# Patient Record
Sex: Female | Born: 1960 | Race: Black or African American | Hispanic: No | Smoking: Never smoker
Health system: Southern US, Community
[De-identification: ages and names within clinical notes are randomized; demographics above are authoritative.]

## PROBLEM LIST (undated history)

## (undated) DIAGNOSIS — K219 Gastro-esophageal reflux disease without esophagitis: Secondary | ICD-10-CM

## (undated) DIAGNOSIS — I2510 Atherosclerotic heart disease of native coronary artery without angina pectoris: Secondary | ICD-10-CM

## (undated) DIAGNOSIS — I639 Cerebral infarction, unspecified: Secondary | ICD-10-CM

## (undated) DIAGNOSIS — M1990 Unspecified osteoarthritis, unspecified site: Secondary | ICD-10-CM

## (undated) DIAGNOSIS — E7800 Pure hypercholesterolemia, unspecified: Secondary | ICD-10-CM

## (undated) DIAGNOSIS — G43909 Migraine, unspecified, not intractable, without status migrainosus: Secondary | ICD-10-CM

## (undated) DIAGNOSIS — R42 Dizziness and giddiness: Secondary | ICD-10-CM

## (undated) HISTORY — DX: Atherosclerotic heart disease of native coronary artery without angina pectoris: I25.10

## (undated) HISTORY — PX: CARDIAC CATHETERIZATION: SHX172

---
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
## 1998-12-11 ENCOUNTER — Emergency Department (HOSPITAL_COMMUNITY): Admission: EM | Admit: 1998-12-11 | Discharge: 1998-12-11 | Payer: Self-pay

## 2001-01-14 ENCOUNTER — Emergency Department (HOSPITAL_COMMUNITY): Admission: EM | Admit: 2001-01-14 | Discharge: 2001-01-14 | Payer: Self-pay | Admitting: Emergency Medicine

## 2002-04-06 ENCOUNTER — Emergency Department (HOSPITAL_COMMUNITY): Admission: EM | Admit: 2002-04-06 | Discharge: 2002-04-06 | Payer: Self-pay | Admitting: *Deleted

## 2003-05-17 ENCOUNTER — Emergency Department (HOSPITAL_COMMUNITY): Admission: EM | Admit: 2003-05-17 | Discharge: 2003-05-17 | Payer: Self-pay | Admitting: Emergency Medicine

## 2003-09-09 ENCOUNTER — Emergency Department (HOSPITAL_COMMUNITY): Admission: AD | Admit: 2003-09-09 | Discharge: 2003-09-09 | Payer: Self-pay | Admitting: Family Medicine

## 2003-11-05 ENCOUNTER — Inpatient Hospital Stay (HOSPITAL_COMMUNITY): Admission: EM | Admit: 2003-11-05 | Discharge: 2003-11-07 | Payer: Self-pay | Admitting: Emergency Medicine

## 2003-11-06 ENCOUNTER — Encounter (INDEPENDENT_AMBULATORY_CARE_PROVIDER_SITE_OTHER): Payer: Self-pay | Admitting: Cardiology

## 2003-11-29 ENCOUNTER — Emergency Department (HOSPITAL_COMMUNITY): Admission: EM | Admit: 2003-11-29 | Discharge: 2003-11-29 | Payer: Self-pay

## 2004-01-10 ENCOUNTER — Encounter: Admission: RE | Admit: 2004-01-10 | Discharge: 2004-01-10 | Payer: Self-pay | Admitting: Internal Medicine

## 2004-01-31 ENCOUNTER — Emergency Department (HOSPITAL_COMMUNITY): Admission: EM | Admit: 2004-01-31 | Discharge: 2004-01-31 | Payer: Self-pay | Admitting: Emergency Medicine

## 2004-05-21 ENCOUNTER — Emergency Department (HOSPITAL_COMMUNITY): Admission: EM | Admit: 2004-05-21 | Discharge: 2004-05-21 | Payer: Self-pay | Admitting: Emergency Medicine

## 2004-08-20 ENCOUNTER — Emergency Department (HOSPITAL_COMMUNITY): Admission: EM | Admit: 2004-08-20 | Discharge: 2004-08-20 | Payer: Self-pay | Admitting: Emergency Medicine

## 2004-08-23 ENCOUNTER — Emergency Department (HOSPITAL_COMMUNITY): Admission: EM | Admit: 2004-08-23 | Discharge: 2004-08-23 | Payer: Self-pay | Admitting: Emergency Medicine

## 2004-08-30 ENCOUNTER — Ambulatory Visit: Payer: Self-pay | Admitting: Family Medicine

## 2004-10-22 ENCOUNTER — Ambulatory Visit: Payer: Self-pay | Admitting: Family Medicine

## 2005-01-31 ENCOUNTER — Emergency Department (HOSPITAL_COMMUNITY): Admission: EM | Admit: 2005-01-31 | Discharge: 2005-01-31 | Payer: Self-pay | Admitting: Emergency Medicine

## 2006-01-20 ENCOUNTER — Ambulatory Visit: Payer: Self-pay | Admitting: Sports Medicine

## 2006-02-07 ENCOUNTER — Encounter (INDEPENDENT_AMBULATORY_CARE_PROVIDER_SITE_OTHER): Payer: Self-pay | Admitting: *Deleted

## 2006-02-07 LAB — CONVERTED CEMR LAB

## 2006-02-10 ENCOUNTER — Ambulatory Visit: Payer: Self-pay | Admitting: Family Medicine

## 2006-03-03 ENCOUNTER — Ambulatory Visit: Payer: Self-pay | Admitting: Family Medicine

## 2006-06-25 ENCOUNTER — Emergency Department (HOSPITAL_COMMUNITY): Admission: EM | Admit: 2006-06-25 | Discharge: 2006-06-25 | Payer: Self-pay | Admitting: Emergency Medicine

## 2006-08-31 ENCOUNTER — Ambulatory Visit: Payer: Self-pay | Admitting: Family Medicine

## 2006-12-11 ENCOUNTER — Emergency Department (HOSPITAL_COMMUNITY): Admission: EM | Admit: 2006-12-11 | Discharge: 2006-12-11 | Payer: Self-pay | Admitting: Emergency Medicine

## 2006-12-31 DIAGNOSIS — G43909 Migraine, unspecified, not intractable, without status migrainosus: Secondary | ICD-10-CM | POA: Insufficient documentation

## 2006-12-31 DIAGNOSIS — N951 Menopausal and female climacteric states: Secondary | ICD-10-CM | POA: Insufficient documentation

## 2007-01-01 ENCOUNTER — Encounter (INDEPENDENT_AMBULATORY_CARE_PROVIDER_SITE_OTHER): Payer: Self-pay | Admitting: *Deleted

## 2007-03-05 ENCOUNTER — Encounter: Payer: Self-pay | Admitting: Family Medicine

## 2007-03-05 ENCOUNTER — Encounter: Payer: Self-pay | Admitting: *Deleted

## 2007-03-05 ENCOUNTER — Ambulatory Visit: Payer: Self-pay | Admitting: Family Medicine

## 2007-03-05 LAB — CONVERTED CEMR LAB
Bilirubin Urine: NEGATIVE
Chlamydia, DNA Probe: NEGATIVE
GC Probe Amp, Genital: NEGATIVE
Glucose, Urine, Semiquant: NEGATIVE
Ketones, urine, test strip: NEGATIVE
Nitrite: NEGATIVE
Protein, U semiquant: 30
Specific Gravity, Urine: 1.025
Urobilinogen, UA: 0.2
pH: 6.5

## 2007-03-08 ENCOUNTER — Encounter: Payer: Self-pay | Admitting: Family Medicine

## 2007-03-08 ENCOUNTER — Telehealth: Payer: Self-pay | Admitting: *Deleted

## 2007-03-10 ENCOUNTER — Encounter: Payer: Self-pay | Admitting: Family Medicine

## 2007-03-23 ENCOUNTER — Encounter: Payer: Self-pay | Admitting: Family Medicine

## 2007-03-23 ENCOUNTER — Telehealth: Payer: Self-pay | Admitting: *Deleted

## 2007-03-23 ENCOUNTER — Ambulatory Visit: Payer: Self-pay

## 2007-03-23 LAB — CONVERTED CEMR LAB
BUN: 14 mg/dL (ref 6–23)
CO2: 24 meq/L (ref 19–32)
Calcium: 9.4 mg/dL (ref 8.4–10.5)
Chloride: 108 meq/L (ref 96–112)
Creatinine, Ser: 0.93 mg/dL (ref 0.40–1.20)
Glucose, Bld: 87 mg/dL (ref 70–99)
Potassium: 4 meq/L (ref 3.5–5.3)
Sodium: 142 meq/L (ref 135–145)

## 2007-03-26 ENCOUNTER — Encounter: Payer: Self-pay | Admitting: Family Medicine

## 2007-04-14 ENCOUNTER — Telehealth: Payer: Self-pay | Admitting: Family Medicine

## 2007-04-21 ENCOUNTER — Ambulatory Visit: Payer: Self-pay | Admitting: Family Medicine

## 2007-04-21 DIAGNOSIS — Z8639 Personal history of other endocrine, nutritional and metabolic disease: Secondary | ICD-10-CM

## 2007-04-21 DIAGNOSIS — Z862 Personal history of diseases of the blood and blood-forming organs and certain disorders involving the immune mechanism: Secondary | ICD-10-CM | POA: Insufficient documentation

## 2007-05-28 ENCOUNTER — Telehealth: Payer: Self-pay | Admitting: *Deleted

## 2008-03-07 ENCOUNTER — Ambulatory Visit: Payer: Self-pay | Admitting: Family Medicine

## 2008-03-09 ENCOUNTER — Ambulatory Visit: Payer: Self-pay | Admitting: Family Medicine

## 2008-04-06 ENCOUNTER — Ambulatory Visit: Payer: Self-pay | Admitting: Family Medicine

## 2008-04-06 DIAGNOSIS — E669 Obesity, unspecified: Secondary | ICD-10-CM | POA: Insufficient documentation

## 2008-04-06 LAB — CONVERTED CEMR LAB: Whiff Test: NEGATIVE

## 2008-08-11 ENCOUNTER — Encounter: Payer: Self-pay | Admitting: Family Medicine

## 2009-01-31 ENCOUNTER — Emergency Department (HOSPITAL_COMMUNITY): Admission: EM | Admit: 2009-01-31 | Discharge: 2009-01-31 | Payer: Self-pay | Admitting: Emergency Medicine

## 2010-03-17 ENCOUNTER — Emergency Department (HOSPITAL_COMMUNITY): Admission: EM | Admit: 2010-03-17 | Discharge: 2010-03-17 | Payer: Self-pay | Admitting: Emergency Medicine

## 2010-03-17 ENCOUNTER — Encounter: Payer: Self-pay | Admitting: Family Medicine

## 2010-03-18 ENCOUNTER — Encounter: Payer: Self-pay | Admitting: Family Medicine

## 2010-03-18 ENCOUNTER — Inpatient Hospital Stay (HOSPITAL_COMMUNITY): Admission: EM | Admit: 2010-03-18 | Discharge: 2010-03-19 | Payer: Self-pay | Admitting: Emergency Medicine

## 2010-03-18 ENCOUNTER — Ambulatory Visit: Payer: Self-pay | Admitting: Family Medicine

## 2010-03-19 ENCOUNTER — Encounter: Payer: Self-pay | Admitting: Family Medicine

## 2010-03-20 LAB — CONVERTED CEMR LAB
Cholesterol: 198 mg/dL
HDL: 49 mg/dL
LDL Cholesterol: 133 mg/dL
Triglycerides: 99 mg/dL

## 2010-03-21 ENCOUNTER — Ambulatory Visit: Payer: Self-pay | Admitting: Family Medicine

## 2010-03-21 ENCOUNTER — Encounter: Payer: Self-pay | Admitting: Family Medicine

## 2010-03-21 DIAGNOSIS — E785 Hyperlipidemia, unspecified: Secondary | ICD-10-CM | POA: Insufficient documentation

## 2010-03-21 DIAGNOSIS — I6350 Cerebral infarction due to unspecified occlusion or stenosis of unspecified cerebral artery: Secondary | ICD-10-CM | POA: Insufficient documentation

## 2010-03-27 ENCOUNTER — Telehealth: Payer: Self-pay | Admitting: Family Medicine

## 2010-03-28 ENCOUNTER — Encounter: Payer: Self-pay | Admitting: Family Medicine

## 2010-03-29 ENCOUNTER — Emergency Department (HOSPITAL_COMMUNITY): Admission: EM | Admit: 2010-03-29 | Discharge: 2010-03-29 | Payer: Self-pay | Admitting: Family Medicine

## 2010-03-29 ENCOUNTER — Telehealth: Payer: Self-pay | Admitting: Family Medicine

## 2010-06-12 ENCOUNTER — Encounter: Payer: Self-pay | Admitting: Family Medicine

## 2010-12-03 NOTE — Letter (Signed)
Summary: Out of Work  [facility]  [address]   [place], [place] [address]   Phone: [phone]  Fax: [phone]    Mar 21, 2010   Employee:  BETHEL SIROIS    To Whom It May Concern:   For Medical reasons, please excuse the above named employee from work for the following dates:  Start:   Mar 18, 2010  End:   Mar 22, 2010  It is OK for her to return to work after May 20th.  If you need additional information, please feel free to contact our office.         Sincerely,    Asher Muir MD

## 2010-12-03 NOTE — Letter (Signed)
Summary: Out of Work  [facility]  [address]   [place], [place] [address]   Phone: [phone]  Fax: [phone]    Mar 28, 2010   Employee:  SOLANA COGGIN    To Whom It May Concern:   For Medical reasons, please excuse the above named employee from work for the following dates:  Start:   03/26/2010  End:   03/26/2010  If you need additional information, please feel free to contact our office.         Sincerely,    Asher Muir MD

## 2010-12-03 NOTE — Progress Notes (Signed)
Summary: Note Needed   Phone Note Call from Patient Call back at [phone]   Caller: Patient Summary of Call: Pt was in hospital and went to work Monday, but did not go Tuesday due to feeling really bad.  Can she get a note for the rest of the week? Initial call taken by: Clydell Hakim,  Mar 27, 2010 1:49 PM  Follow-up for Phone Call        to PCP Follow-up by: Gladstone Pih,  Mar 27, 2010 2:49 PM  Additional Follow-up for Phone Call Additional follow up Details #1::        I wrote the letter, but please strongly advise her to come in for an appointment if she is feeling that badly.  thanks Additional Follow-up by: Asher Muir MD,  Mar 28, 2010 8:16 AM    called and left msg for pt to return call.Loralee Pacas CMA  Mar 28, 2010 12:27 PM

## 2010-12-03 NOTE — Consult Note (Signed)
Summary: [facility]: ECHO Report  [facility]   Imported By: Clydell Hakim 06/14/2010 12:14:24  _____________________________________________________________________  External Attachment:    Type:   Image     Comment:   External Document

## 2010-12-03 NOTE — Progress Notes (Signed)
Summary: possible re-CVA? sent to ED   Daughter Enrique Sack [phone]:  Yesterday afternoon eyes looked droopy on both sides. Mother not speaking well (running some words together). Sluggish all day today and slowed movements. Normally very energetic. No difficulty walking. No limp. Was diagnosed with stroke on the 15th. Told the patient to come be evalutated in the ED. She is taking her ASA and Simvastatin. Jamie Brookes MD  Mar 29, 2010 6:21 PM

## 2010-12-03 NOTE — Assessment & Plan Note (Signed)
Summary: h/fup,tcb   Vital Signs:  Patient profile:   50 year old female Height:      63 inches Weight:      206.1 pounds BMI:     36.64 Temp:     98.6 degrees F oral Pulse rate:   72 / minute BP sitting:   123 / 70  (left arm) Cuff size:   large  Vitals Entered By: Gladstone Pih (Mar 21, 2010 3:50 PM) CC: Hosp F/U for CVA Is Patient Diabetic? No Pain Assessment Patient in pain? no        Primary Care Provider:  Asher Muir MD  CC:  Hosp F/U for CVA.  History of Present Illness: Pt who has not been seen at [facility] since 2009 here for hospital f/u visit after a CVA.  1.  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  she was supposed to be on aspirin and zocor since that stroke, but was not taking them consistently.  had a normal work-up, including echo and carotid dopplers.  discharged on asa and zocor.  since d/c has had a headache on the left, but has had no confusion, facial droop, one-sided weakness or numbness, trouble speaking, vision changes, or other focal neuro deficits  2.  hyperlipidemia--is above goal at 133.  she was restarted on zocor  Habits & Providers  Alcohol-Tobacco-Diet     Tobacco Status: never  Current Medications (verified): 1)  Simvastatin 40 Mg Tabs (Simvastatin) .Marland Kitchen.. 1 Tab By Mouth At Bedtime For Cholesterol 2)  Aspir-Low 81 Mg Tbec (Aspirin) .Marland Kitchen.. 1 Tab By Mouth Daily  Allergies: 1)  ! * Codiene  Past History:  Past Medical History: CVA in [place] 10/2009  Rt frontal and Lt parietal infarct and small vessel ischemic disease CVA 5/11-- left anterior circulation infarct in left MCA territory. breast mass- benign- removed at age 65 NSVD X4 hospitalized for chest pain--found to be anxiety.  heart cath normal.  2005  Review of Systems General:  Denies fever. Neuro:  Complains of headaches; denies brief paralysis, difficulty with concentration, disturbances in coordination, inability to speak, numbness, poor balance, and visual  disturbances; she is having some residual memoy problems since the stroke, but these have improved since discharge.  Physical Exam  General:  Well-developed,well-nourished,in no acute distress; alert,appropriate and cooperative throughout examination Lungs:  Normal respiratory effort, chest expands symmetrically. Lungs are clear to auscultation, no crackles or wheezes. Heart:  Normal rate and regular rhythm. S1 and S2 normal without gallop, murmur, click, rub or other extra sounds. Neurologic:  alert & oriented X3 and major cranial nerves II-XII intact.  alert & oriented X3, cranial nerves II-XII intact, strength normal in all extremities, sensation intact to light touch, gait normal, and finger-to-nose normal.  normal rapid alternating movements Additional Exam:  vital signs reviewed    Impression & Recommendations:  Problem # 1:  Hx of CEREBROVASCULAR ACCIDENT (ICD-434.91) Assessment New  continue aspirin and simva.  gave red flags for seeking urgent medical care.  blood pressuer is well-controlled and she is not diabetic; so no further secondary prevention measures needed at this time Her updated medication list for this problem includes:    Aspir-low 81 Mg Tbec (Aspirin) .Marland Kitchen... 1 tab by mouth daily  Orders: [facility]- Est  Level 4 ([address])  Problem # 2:  DYSLIPIDEMIA (ICD-272.4) Assessment: New  goal is <100, preferably under 70.  cont simva.  recheck CMET in 12 weeks Her updated medication list for this problem includes:  Simvastatin 40 Mg Tabs (Simvastatin) .Marland Kitchen... 1 tab by mouth at bedtime for cholesterol  Orders: [facility]- Est  Level 4 ([address])  Problem # 3:  PREVENTIVE HEALTH CARE (ICD-V70.0) Assessment: Unchanged due for pap and mammo (past due)--advised to schedule also needs colonoscopy--will discuss at physical  Complete Medication List: 1)  Simvastatin 40 Mg Tabs (Simvastatin) .Marland Kitchen.. 1 tab by mouth at bedtime for cholesterol 2)  Aspir-low 81 Mg Tbec (Aspirin) .Marland Kitchen.. 1 tab by mouth  daily  Patient Instructions: 1)  It was nice to see you today. 2)  Make an appointment with Dr. Gerilyn Pilgrim for nutrtion counseling. 3)  I'm glad you've decided to take your medicines. 4)  Get your mammogram!!! 5)  Please schedule a physical and pap smear at your convenience. 6)  If you have worsening that does not get better with tylenol, face drooping, trouble speaking, memory problems, one-sided weakness or anything else that really worries you, call 911. 7)  Make a follow up appointment for your cholesterol around the middle of August.  Prescriptions: SIMVASTATIN 40 MG TABS (SIMVASTATIN) 1 tab by mouth at bedtime for cholesterol  #33 x 6   Entered and Authorized by:   Asher Muir MD   Signed by:   Asher Muir MD on 03/21/2010   Method used:   [facility]        [facility]  [address] [phone]* (retail)       [address]       [place], [place]  [address]       Ph: [phone] or [phone]       Fax: [phone]   RxID:   [id number]    Prevention & Chronic Care Immunizations   Influenza vaccine: Not documented    Tetanus booster: 02/07/2006: Done.    Pneumococcal vaccine: Not documented  Colorectal Screening   Hemoccult: Not documented    Colonoscopy: Not documented  Other Screening   Pap smear: Done.  (02/07/2006)    Mammogram: Not documented   Smoking status: never  (03/21/2010)  Lipids   Total Cholesterol: 198  (03/20/2010)   LDL: 133  (03/20/2010)   LDL Direct: Not documented   HDL: 49  (03/20/2010)   Triglycerides: 99  (03/20/2010)    SGOT (AST): Not documented   SGPT (ALT): Not documented   Alkaline phosphatase: Not documented   Total bilirubin: Not documented  Self-Management Support :    Lipid self-management support: Not documented

## 2010-12-18 ENCOUNTER — Encounter: Payer: Self-pay | Admitting: *Deleted

## 2011-01-15 ENCOUNTER — Telehealth: Payer: Self-pay | Admitting: Family Medicine

## 2011-01-15 MED ORDER — SIMVASTATIN 40 MG PO TABS: 40.0000 mg | ORAL_TABLET | Freq: Every day | ORAL | Status: DC

## 2011-01-15 NOTE — Telephone Encounter (Signed)
Spoke with daughter who called for patient because patient is at work and not able to call during our office hours. The symptoms she has been experiencing are chills Sunday, Monday, and Tuesday but she is feeling better today .  The meds in question are simvastatin and aspirin.   Daughter states she has been without meds all of 2012 because she has not been able to get to pharmacy to pick them up. advised that Rx for  aspirin is current, but she will need new Rx for Simvastatin. Will send message to MD to ask if she will refill.. [facility] [place].

## 2011-01-15 NOTE — Telephone Encounter (Signed)
Pt stopped taking both her meds & is having with drawels, wants to know if resuming the medication will be harmful?

## 2011-01-15 NOTE — Telephone Encounter (Signed)
Simvastatin and aspirin don't cause withdrawal.  It is safe to take.  I will refill simvastatin for one month, but patient needs to schedule office visit as I have never med her and is has been 10 months since her last appointment.

## 2011-01-15 NOTE — Telephone Encounter (Signed)
Patient notified

## 2011-01-18 ENCOUNTER — Emergency Department (HOSPITAL_COMMUNITY)
Admission: EM | Admit: 2011-01-18 | Discharge: 2011-01-18 | Disposition: A | Discharge status: Home / Self Care | Payer: Self-pay | Attending: Emergency Medicine | Admitting: Emergency Medicine

## 2011-01-18 DIAGNOSIS — W540XXA Bitten by dog, initial encounter: Secondary | ICD-10-CM | POA: Insufficient documentation

## 2011-01-18 DIAGNOSIS — Z79899 Other long term (current) drug therapy: Secondary | ICD-10-CM | POA: Insufficient documentation

## 2011-01-18 DIAGNOSIS — Z8673 Personal history of transient ischemic attack (TIA), and cerebral infarction without residual deficits: Secondary | ICD-10-CM | POA: Insufficient documentation

## 2011-01-18 DIAGNOSIS — Z7982 Long term (current) use of aspirin: Secondary | ICD-10-CM | POA: Insufficient documentation

## 2011-01-18 DIAGNOSIS — S01409A Unspecified open wound of unspecified cheek and temporomandibular area, initial encounter: Secondary | ICD-10-CM | POA: Insufficient documentation

## 2011-01-18 DIAGNOSIS — S01309A Unspecified open wound of unspecified ear, initial encounter: Secondary | ICD-10-CM | POA: Insufficient documentation

## 2011-01-20 LAB — PROTIME-INR
INR: 1.02 (ref 0.00–1.49)
Prothrombin Time: 13.3 seconds (ref 11.6–15.2)
Prothrombin Time: 13.4 seconds (ref 11.6–15.2)

## 2011-01-20 LAB — COMPREHENSIVE METABOLIC PANEL
ALT: 15 U/L (ref 0–35)
ALT: 16 U/L (ref 0–35)
AST: 27 U/L (ref 0–37)
Albumin: 3.4 g/dL — ABNORMAL LOW (ref 3.5–5.2)
Albumin: 4 g/dL (ref 3.5–5.2)
Alkaline Phosphatase: 105 U/L (ref 39–117)
Alkaline Phosphatase: 92 U/L (ref 39–117)
BUN: 10 mg/dL (ref 6–23)
CO2: 27 mEq/L (ref 19–32)
Calcium: 9.1 mg/dL (ref 8.4–10.5)
Chloride: 104 mEq/L (ref 96–112)
Creatinine, Ser: 0.94 mg/dL (ref 0.4–1.2)
GFR calc Af Amer: >60 mL/min (ref >60)
GFR calc Af Amer: >60 mL/min (ref >60)
GFR calc non Af Amer: >60 mL/min (ref >60)
Glucose, Bld: 98 mg/dL (ref 70–99)
Potassium: 3.5 mEq/L (ref 3.5–5.1)
Potassium: 3.9 mEq/L (ref 3.5–5.1)
Sodium: 137 mEq/L (ref 135–145)
Sodium: 139 mEq/L (ref 135–145)
Total Bilirubin: 0.6 mg/dL (ref 0.3–1.2)
Total Protein: 6.8 g/dL (ref 6.0–8.3)
Total Protein: 7.9 g/dL (ref 6.0–8.3)

## 2011-01-20 LAB — CBC
HCT: 40.2 % (ref 36.0–46.0)
Hemoglobin: 13.8 g/dL (ref 12.0–15.0)
MCHC: 34.2 g/dL (ref 30.0–36.0)
MCV: 90 fL (ref 78.0–100.0)
Platelets: 262 10*3/uL (ref 150–400)
Platelets: 288 10*3/uL (ref 150–400)
RBC: 4.46 MIL/uL (ref 3.87–5.11)
RDW: 14.1 % (ref 11.5–15.5)
RDW: 14.1 % (ref 11.5–15.5)
WBC: 8.2 10*3/uL (ref 4.0–10.5)

## 2011-01-20 LAB — DIFFERENTIAL
Basophils Absolute: 0.1 10*3/uL (ref 0.0–0.1)
Basophils Relative: 1 % (ref 0–1)
Basophils Relative: 3 % — ABNORMAL HIGH (ref 0–1)
Eosinophils Absolute: 0 10*3/uL (ref 0.0–0.7)
Eosinophils Absolute: 0.1 10*3/uL (ref 0.0–0.7)
Eosinophils Relative: 1 % (ref 0–5)
Lymphocytes Relative: 37 % (ref 12–46)
Lymphs Abs: 3 10*3/uL (ref 0.7–4.0)
Lymphs Abs: 3.4 10*3/uL (ref 0.7–4.0)
Monocytes Absolute: 0.2 10*3/uL (ref 0.1–1.0)
Monocytes Absolute: 0.3 10*3/uL (ref 0.1–1.0)
Monocytes Relative: 3 % (ref 3–12)
Monocytes Relative: 3 % (ref 3–12)
Neutro Abs: 4.8 10*3/uL (ref 1.7–7.7)
Neutrophils Relative %: 59 % (ref 43–77)

## 2011-01-20 LAB — GLUCOSE, CAPILLARY
Glucose-Capillary: 76 mg/dL (ref 70–99)
Glucose-Capillary: 80 mg/dL (ref 70–99)
Glucose-Capillary: 91 mg/dL (ref 70–99)
Glucose-Capillary: 93 mg/dL (ref 70–99)
Glucose-Capillary: 95 mg/dL (ref 70–99)
Glucose-Capillary: 95 mg/dL (ref 70–99)

## 2011-01-20 LAB — CK TOTAL AND CKMB (NOT AT ARMC)
CK, MB: 0.8 ng/mL (ref 0.3–4.0)
CK, MB: 1.2 ng/mL (ref 0.3–4.0)
Relative Index: 1 (ref 0.0–2.5)
Relative Index: INVALID (ref 0.0–2.5)
Total CK: 118 U/L (ref 7–177)
Total CK: 73 U/L (ref 7–177)

## 2011-01-20 LAB — CARDIAC PANEL(CRET KIN+CKTOT+MB+TROPI)
CK, MB: 0.8 ng/mL (ref 0.3–4.0)
Total CK: 78 U/L (ref 7–177)

## 2011-01-20 LAB — BRAIN NATRIURETIC PEPTIDE: Pro B Natriuretic peptide (BNP): <30 pg/mL (ref 0.0–100.0)

## 2011-01-20 LAB — LIPID PANEL
Cholesterol: 198 mg/dL (ref 0–200)
HDL: 49 mg/dL (ref >39)
LDL Cholesterol: 133 mg/dL — ABNORMAL HIGH (ref 0–99)
Total CHOL/HDL Ratio: 4 RATIO

## 2011-01-20 LAB — APTT
aPTT: 30 seconds (ref 24–37)
aPTT: 31 seconds (ref 24–37)

## 2011-01-20 LAB — TROPONIN I
Troponin I: 0.12 ng/mL — ABNORMAL HIGH (ref 0.00–0.06)
Troponin I: 0.13 ng/mL — ABNORMAL HIGH (ref 0.00–0.06)
Troponin I: 0.14 ng/mL — ABNORMAL HIGH (ref 0.00–0.06)

## 2011-01-22 NOTE — Consult Note (Signed)
NAME:  Sue Armstrong, Sue Armstrong                ACCOUNT NO.:  [id number]  MEDICAL RECORD NO.:  [id number]           PATIENT TYPE:  E  LOCATION:  [facility]                         FACILITY:  [facility]  PHYSICIAN:  Zola Button T. Lazarus Salines, M.D. DATE OF BIRTH:  1961-07-13  DATE OF CONSULTATION:  01/18/2011 DATE OF DISCHARGE:  01/18/2011                                CONSULTATION   CHIEF COMPLAINT:  Dog bite lacerations, right ear and neck.  HISTORY:  A 50 year old black female was playing with her father's dog several hours ago when it snapped at her right face and neck sustaining lacerations.  She is here for evaluation.  ENT was called in consultation for complex lacerations.  She did not lose consciousness. She did not lose an excessive amount of blood.  The dog has been previously healthy, familiar to the patient, and reportedly up-to-date on all his vaccinations.  She herself is probably not up-to-date on tetanus.  PHYSICAL EXAMINATION:  This is a pleasant slightly heavy-set middle-aged black female.  Mental status is intact.  She is mildly distressed.  She is not actively bleeding.  Facial nerve is intact, all branches including ramus, mandibularis on the right side.  She has an incomplete amputation of the auricular  lobule with complex stellate lacerations on the anterior and posterior surface of a full-thickness lobular laceration.  She has a 8-cm "wavy "laceration crossing the body of the mandible down into the anterior neck.  On exploration, this has deep extension approximately 6 cm.  There is a puncture wound just inferior, which was deepened to allow for a Penrose drain placement to communicate with the major cavity.  IMPRESSION: 1. Incomplete amputation/complex full-thickness laceration, right     auricular lobule. 2. 8-cm laceration, right upper neck with deep plain penetration. 3. Superficial punctures and excoriations.  PLAN:  With informed consent, I anesthetized all sites with 2%  Xylocaine with 1:200,000 epinephrine totally 25 mL by the end of the procedure. After achieving anesthesia, a sterile preparation with 50:50 mixture of Betadine solution and sterile saline was performed.  Hemostasis was still observed.  Beginning with the lobule, the pieces were manipulated to inappropriate fit.  The deep layers were closed with interrupted 4-0 Vicryl to reapproximate the basic structure.  After reapproximating the several small flaps into something the roughly the shape of the lobule, then the multiple lacerations were closed with interrupted 5-0 Ethilon suture. The tip of the lobule was very slightly dusky, but seem to have reasonably good circulation throughout.  Good configuration of the lobule was accomplished and hemostasis was observed.  Attention was turned to the neck.  The posterior aspect of the laceration was still somewhat sensitive and additional Xylocaine with epinephrine along the total above was infiltrated.  The wound was cleaned with Betadine and saline again.  There was no visible foreign material in the wound.  The puncture wound inferior to the main cavity was extended into the main cavity using the suture scissors.  A one- quarter inch Penrose drain was brought out of this wound and packed into the cavity.  The wound itself was closed in a  cosmetic fashion with subcutaneous 4-0 Vicryl sutures and a running simple 5-0 Ethilon on the skin surface.  The drain was secured with the same suture.  Hemostasis was observed.  Both wounds were cleaned.  Bacitracin ointment was applied.  A fluff, 4-inch Kerlix followed by 3-inch Ace wrap pressure dressing was placed on the neck. The patient was at this point completed.  The ER physicians will give her a prescription for antibiotics and analgesics.  I recommend ice and elevation.  I will see her back in my office in 2 days for dressing change and probable drain removal.     Zola Button T. Lazarus Salines,  M.D.     KTW/MEDQ  D:  01/18/2011  T:  01/19/2011  Job:  [id number]  Electronically Signed by Flo Shanks M.D. on 01/22/2011 07:19:46 PM

## 2011-03-21 NOTE — Cardiovascular Report (Signed)
NAME:  KENIDY, CROSSLAND                          ACCOUNT NO.:  [id number]   MEDICAL RECORD NO.:  [id number]                   PATIENT TYPE:  INP   LOCATION:  2033                                 FACILITY:  [facility]   PHYSICIAN:  Darden Palmer., M.D.         DATE OF BIRTH:  11-02-1961   DATE OF PROCEDURE:  11/06/2003  DATE OF DISCHARGE:                              CARDIAC CATHETERIZATION   PROCEDURE:  Cardiac catheterization.   CARDIOLOGIST:  Georga Hacking, M.D.   INDICATIONS:  [age]-year-old black female presented with prolonged  chest discomfort that was associated with mild elevations in troponin and CK-  MB.  This study is done to evaluate for coronary artery disease.   DESCRIPTION OF PROCEDURE:  The patient tolerated the procedure well without  complications.  She had good hemostasis at the peripheral pulses noted.  The  right coronary artery received intracoronary nitroglycerin with no change in  the caliber of the distal vessel at the end of the procedure. She tolerated  the procedure well.   HEMODYNAMIC DATA:  Aorta post contrast 102/64, LV post contrast 102/6-13.  6 French coronary catheters and a 7 French Swan-Ganz catheter.   ANGIOGRAPHIC DATA:  LEFT VENTRICULOGRAM:  Performed in the 30 degree RAO  projection.  The aortic valve was normal.  The mitral valve was normal.  The  left ventricle is normal in size.  There was mild hypokinesis possibly of  the mid inferior wall.  Estimated ejection fraction is 50-55%.   CORONARY ARTERIOGRAPHY:  Coronary arteries arise and distribute normally.  No calcification is noted.   The left main coronary artery is normal.   The left anterior descending is a large vessel that extends around the apex  and is normal.   Circumflex coronary artery:  Normal.   Right coronary artery:  The right coronary artery is normal proximally and  in the posterior descending.  A posterior lateral branch is very diminutive  in size off  the continuation branch and is thread-like.  This may represent  a congenital abnormality or could be small vessel disease.  It does not  reverse with nitroglycerin.   IMPRESSION:  1. No significant large vessel coronary artery disease identified.  The     coronaries appear normal.  2. Very diminutive posterior lateral branch that may be a normal variant.  3. Evidence of normal left ventricular function.                                               Darden Palmer., M.D.    WST/MEDQ  D:  11/06/2003  T:  11/06/2003  Job:  [id number]   cc:   Melissa L. Ladona Ridgel, MD

## 2011-03-21 NOTE — Consult Note (Signed)
NAME:  Sue Armstrong, Sue Armstrong                          ACCOUNT NO.:  [id number]   MEDICAL RECORD NO.:  [id number]                   PATIENT TYPE:  INP   LOCATION:  1824                                 FACILITY:  [facility]   PHYSICIAN:  W. Ashley Royalty., M.D.         DATE OF BIRTH:  1960/12/25   DATE OF CONSULTATION:  11/05/2003  DATE OF DISCHARGE:                                   CONSULTATION   HISTORY:  This is a 50 year old black female seen at the request of the  emergency room physician for evaluation of chest discomfort.  The patient  has previously been in good health and has felt well.  She is a nonsmoker  and has no prior history of significant medical problems except for a  possible history of ulcers previously.  She had the onset of anterior  discomfort around 10 a.m. this morning described as pressure and heaviness  that came on at rest.  She noted the pain was fairly severe but no  associated with diaphoresis.  She finally was brought to the emergency room  by her family around 6 p.m. but was not brought back to the examination room  until approximately 12 midnight or 1 a.m.  At that time an EKG was done that  showed mild diffuse ST elevation and a set of cardiac markers was drawn.  When the cardiac markers returned with some abnormality I was called by the  emergency room physician and was asked to see the patient.  The patient  continues to have some mild ongoing chest discomfort rated at no more than 4  out of 5.  It is non radiating but was associated with some mild tingling of  her lips and her fingers.  She denies any recent history of cocaine use or  alcohol use and normally does not use any medications.  She has not had any  recent infections.   PAST MEDICAL HISTORY:  Negative for hypertension or diabetes.  Her  cholesterol status is unknown.   PREVIOUS SURGERY:  Removal of breast cyst.   ALLERGIES:  None.   MEDICATIONS:  None.   FAMILY HISTORY:  Father is living.   Her mother died and was hemodialysis,  died of some sort of heart disease.  She has a sister that has a valve  problem.  There is not family history of premature cardiac disease however.   SOCIAL HISTORY:  She states that she recently married her husband who  appears older than her, about one month ago.  She has two children at home,  two that are no longer at home.  She is a nonsmoker and does not use alcohol  to excess.  She states that she used cocaine maybe 25 years ago but denies  any recent history of drug use or cocaine use.   REVIEW OF SYSTEMS:  She has not had a period since November.  She has no  eye, nose or throat problems.  Does relate a prior history of ulcers.  Denies significant paroxysmal nocturnal dyspnea, orthopnea or edema.  Denies  recent upper respiratory infections or infections.  Denies any genitourinary  problems.  No significant arthritis.   PHYSICAL EXAMINATION:  GENERAL:  She is a thin, black female who is awake  and alert and has no acute distress.  VITAL SIGNS:  Blood pressure is 110/70, pulse 70 and regular.  SKIN:  Warm and dry.  HEENT:  Pupils are equal, round and reactive to light and accommodation.  Extraocular movements are intact. Mouth clear. Pharynx negative.  NECK:  Supple without masses, JVD, thyromegaly or bruits.  LUNGS:  Clear to auscultation and percussion.  CARDIOVASCULAR:  Normal S1, S2, no S3, no rub noted.  ABDOMEN:  Soft and nontender.  Femoral, distal pulses are 2+.   EKG shows mild diffuse ST elevation that could be interpreted as early  repolarization or possible subtle pericarditis.  Chest x-ray was normal.  Her urine pregnancy test is normal.  Initial myoglobin is 81.  CPK is 12.6  and troponin is 5.2.   IMPRESSION:  Prolonged chest discomfort which has been present for over 14  hours with very minimal elevation of CPK and MB.  No acute EKG changes are  noted.  Differential possibility would include an acute coronary syndrome  or  an atypical presentation of myocarditis or pericarditis.   RECOMMENDATIONS:  I would admit her to the hospital, begin on IV heparin,  nitroglycerin, beta blockers, and check serial enzymes.  Because of abnormal  troponins and CPK, likely will require cardiac catheterization for  evaluation to exclude problems.  She may need to have a spiral CT of the  chest looking for pulmonary emboli.  Should have a urine pregnancy test  also.   Thank you for asking me to see her with you.                                               Darden Palmer., M.D.    WST/MEDQ  D:  11/05/2003  T:  11/05/2003  Job:  [id number]

## 2011-03-21 NOTE — Discharge Summary (Signed)
NAME:  Sue Armstrong, Sue Armstrong                          ACCOUNT NO.:  [id number]   MEDICAL RECORD NO.:  [id number]                   PATIENT TYPE:  INP   LOCATION:  2033                                 FACILITY:  [facility]   PHYSICIAN:  Sherin Quarry, MD                   DATE OF BIRTH:  07-15-1961   DATE OF ADMISSION:  11/04/2003  DATE OF DISCHARGE:  11/07/2003                                 DISCHARGE SUMMARY   HISTORY OF PRESENT ILLNESS:  The patient is a 50 year old lady who presented  on January 2, with severe chest pain associated with diaphoresis and  dyspnea.  There was a family history of coronary artery disease.  Physical  examination at the time of admission is described by Hortencia Pilar, M.D.  Breech presentation is 108/66, pulse is 74, respirations 20, and O2  saturation was 100%.  HEENT examination was within normal limits, the chest  was clear.  Cardiovascular examination revealed normal S1 and S2 without  murmurs, rubs, or gallops.  Examination of the extremities revealed no  evidence of cyanosis or edema.  Chest x-ray was within normal limits.  Electrocardiogram revealed normal sinus rhythm with nonspecific ST and T  wave changes, no definite ischemic change. Troponin was 4.66 initially.  On  January 3, troponin was 2.61.  The CK and MB fractions were all negative.  TSH was 1.76.   HOSPITAL COURSE:  Consultation was obtained from Dr. Donnie Aho of the  cardiology service who recommended that the patient be heparinized with  Lovenox and be started on beta blocker.  He also recommended that the  patient undergo cardiac catheterization.  Subsequently, the patient was  switched to intravenous heparin.  On January 3, a cardiac catheterization  was performed.  The preliminary report on this was that the left ventricular  ejection fraction was in the lower limits of normal.  The coronary arteries  appeared normal on the left.  There was evidence of some narrowing of the  distal right  coronary artery possibly secondary to vasospasm.  Echocardiogram was obtained and reviewed.  This showed normal heart valves  and normal left ventricular ejection fraction.  Ultimately, it was  determined that the patient may have evidence of vasospasm and it was  recommended that the patient be treated as such.  On January 4, the patient  was discharged.   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  Studies to evaluate  possible vasculitis are pending.   CONDITION ON DISCHARGE:  Good.   DISCHARGE MEDICATIONS:  1. Norvasc 5 mg daily.  2. Aspirin 81 mg daily.  3. Nitroglycerin p.r.n. for chest pain.   FOLLOW UP:  With Dr. Donnie Aho in two weeks.  The patient also requests that we  make an appointment for her to be followed up in the [facility] for  her primary medical care as her husband is followed in  this practice.                                                Sherin Quarry, MD    SY/MEDQ  D:  11/07/2003  T:  11/07/2003  Job:  [id number]   cc:   [facility]   W. Ashley Royalty., M.D.  [address]  [place]  [place] [address]  Fax: [phone]

## 2011-05-08 ENCOUNTER — Inpatient Hospital Stay (HOSPITAL_COMMUNITY)
Admission: EM | Admit: 2011-05-08 | Discharge: 2011-05-11 | DRG: 282 | Disposition: A | Discharge status: Home / Self Care | Payer: Self-pay | Attending: Internal Medicine | Admitting: Internal Medicine

## 2011-05-08 DIAGNOSIS — I214 Non-ST elevation (NSTEMI) myocardial infarction: Principal | ICD-10-CM | POA: Diagnosis present

## 2011-05-08 DIAGNOSIS — I2582 Chronic total occlusion of coronary artery: Secondary | ICD-10-CM | POA: Diagnosis present

## 2011-05-08 DIAGNOSIS — Z7982 Long term (current) use of aspirin: Secondary | ICD-10-CM

## 2011-05-08 DIAGNOSIS — Z8673 Personal history of transient ischemic attack (TIA), and cerebral infarction without residual deficits: Secondary | ICD-10-CM

## 2011-05-08 DIAGNOSIS — I059 Rheumatic mitral valve disease, unspecified: Secondary | ICD-10-CM | POA: Diagnosis present

## 2011-05-08 DIAGNOSIS — I2510 Atherosclerotic heart disease of native coronary artery without angina pectoris: Secondary | ICD-10-CM | POA: Diagnosis present

## 2011-05-08 DIAGNOSIS — E785 Hyperlipidemia, unspecified: Secondary | ICD-10-CM | POA: Diagnosis present

## 2011-05-08 DIAGNOSIS — Z833 Family history of diabetes mellitus: Secondary | ICD-10-CM

## 2011-05-08 DIAGNOSIS — Z79899 Other long term (current) drug therapy: Secondary | ICD-10-CM

## 2011-05-08 DIAGNOSIS — Z8249 Family history of ischemic heart disease and other diseases of the circulatory system: Secondary | ICD-10-CM

## 2011-05-08 LAB — APTT: aPTT: 32 seconds (ref 24–37)

## 2011-05-08 LAB — POCT I-STAT, CHEM 8
BUN: 15 mg/dL (ref 6–23)
Calcium, Ion: 1.17 mmol/L (ref 1.12–1.32)
Chloride: 104 meq/L (ref 96–112)
Creatinine, Ser: 0.8 mg/dL (ref 0.50–1.10)
Glucose, Bld: 109 mg/dL — ABNORMAL HIGH (ref 70–99)
HCT: 40 % (ref 36.0–46.0)
Hemoglobin: 13.6 g/dL (ref 12.0–15.0)
Potassium: 3.7 meq/L (ref 3.5–5.1)
Sodium: 142 meq/L (ref 135–145)
TCO2: 25 mmol/L (ref 0–100)

## 2011-05-08 LAB — CK TOTAL AND CKMB (NOT AT ARMC)
CK, MB: 9.7 ng/mL (ref 0.3–4.0)
Relative Index: 5.1 — ABNORMAL HIGH (ref 0.0–2.5)
Total CK: 190 U/L — ABNORMAL HIGH (ref 7–177)

## 2011-05-08 LAB — CBC
HCT: 38.1 % (ref 36.0–46.0)
Hemoglobin: 12.6 g/dL (ref 12.0–15.0)
MCH: 29.3 pg (ref 26.0–34.0)
MCHC: 33.1 g/dL (ref 30.0–36.0)
MCV: 88.6 fL (ref 78.0–100.0)
Platelets: 276 10*3/uL (ref 150–400)
RBC: 4.3 MIL/uL (ref 3.87–5.11)
RDW: 14.2 % (ref 11.5–15.5)
WBC: 9.9 10*3/uL (ref 4.0–10.5)

## 2011-05-08 LAB — DIFFERENTIAL
Basophils Absolute: 0 10*3/uL (ref 0.0–0.1)
Basophils Relative: 0 % (ref 0–1)
Eosinophils Absolute: 0.1 10*3/uL (ref 0.0–0.7)
Eosinophils Relative: 1 % (ref 0–5)
Lymphocytes Relative: 24 % (ref 12–46)
Lymphs Abs: 2.4 10*3/uL (ref 0.7–4.0)
Monocytes Absolute: 0.4 10*3/uL (ref 0.1–1.0)
Monocytes Relative: 4 % (ref 3–12)
Neutro Abs: 7 10*3/uL (ref 1.7–7.7)
Neutrophils Relative %: 71 % (ref 43–77)

## 2011-05-08 LAB — TROPONIN I: Troponin I: 0.9 ng/mL

## 2011-05-09 LAB — CARDIAC PANEL(CRET KIN+CKTOT+MB+TROPI)
CK, MB: 32.9 ng/mL (ref 0.3–4.0)
Relative Index: 8.1 — ABNORMAL HIGH (ref 0.0–2.5)
Relative Index: 7.4 — ABNORMAL HIGH (ref 0.0–2.5)
Relative Index: 6.8 — ABNORMAL HIGH (ref 0.0–2.5)
Total CK: 517 U/L — ABNORMAL HIGH (ref 7–177)
Total CK: 446 U/L — ABNORMAL HIGH (ref 7–177)
Total CK: 411 U/L — ABNORMAL HIGH (ref 7–177)
Troponin I: 13.96 ng/mL (ref <0.30)
Troponin I: 12.41 ng/mL (ref <0.30)

## 2011-05-09 LAB — COMPREHENSIVE METABOLIC PANEL
ALT: 16 U/L (ref 0–35)
AST: 46 U/L — ABNORMAL HIGH (ref 0–37)
Albumin: 3.3 g/dL — ABNORMAL LOW (ref 3.5–5.2)
CO2: 25 mEq/L (ref 19–32)
Calcium: 8.6 mg/dL (ref 8.4–10.5)
Creatinine, Ser: 0.62 mg/dL (ref 0.50–1.10)
GFR calc non Af Amer: >60 mL/min (ref >60)
Sodium: 144 mEq/L (ref 135–145)

## 2011-05-09 LAB — HEMOGLOBIN A1C: Hgb A1c MFr Bld: 5.5 % (ref <5.7)

## 2011-05-09 LAB — HEPARIN LEVEL (UNFRACTIONATED): Heparin Unfractionated: <0.1 IU/mL — ABNORMAL LOW (ref 0.30–0.70)

## 2011-05-09 LAB — LIPID PANEL
HDL: 52 mg/dL (ref >39)
LDL Cholesterol: 109 mg/dL — ABNORMAL HIGH (ref 0–99)
Total CHOL/HDL Ratio: 3.5 RATIO
Triglycerides: 116 mg/dL (ref <150)
VLDL: 23 mg/dL (ref 0–40)

## 2011-05-09 LAB — CBC
HCT: 37 % (ref 36.0–46.0)
Hemoglobin: 12.4 g/dL (ref 12.0–15.0)
MCH: 29.6 pg (ref 26.0–34.0)
MCHC: 33.5 g/dL (ref 30.0–36.0)
RBC: 4.19 MIL/uL (ref 3.87–5.11)

## 2011-05-09 LAB — TSH: TSH: 2.371 u[IU]/mL (ref 0.350–4.500)

## 2011-05-09 LAB — CK TOTAL AND CKMB (NOT AT ARMC)
Relative Index: 7.3 — ABNORMAL HIGH (ref 0.0–2.5)
Total CK: 257 U/L — ABNORMAL HIGH (ref 7–177)

## 2011-05-09 LAB — TROPONIN I: Troponin I: 2.16 ng/mL (ref <0.30)

## 2011-05-09 LAB — MRSA PCR SCREENING: MRSA by PCR: NEGATIVE

## 2011-05-10 LAB — BASIC METABOLIC PANEL
BUN: 12 mg/dL (ref 6–23)
Chloride: 108 mEq/L (ref 96–112)
GFR calc Af Amer: >60 mL/min (ref >60)
GFR calc non Af Amer: >60 mL/min (ref >60)
Glucose, Bld: 85 mg/dL (ref 70–99)
Potassium: 3.7 mEq/L (ref 3.5–5.1)
Sodium: 140 mEq/L (ref 135–145)

## 2011-05-10 LAB — CARDIAC PANEL(CRET KIN+CKTOT+MB+TROPI)
CK, MB: 13.1 ng/mL (ref 0.3–4.0)
CK, MB: 10.6 ng/mL (ref 0.3–4.0)
Relative Index: 4.8 — ABNORMAL HIGH (ref 0.0–2.5)
Troponin I: 5.52 ng/mL (ref <0.30)

## 2011-05-10 LAB — DRUGS OF ABUSE SCREEN W/O ALC, ROUTINE URINE
Amphetamine Screen, Ur: NEGATIVE
Benzodiazepines.: NEGATIVE
Creatinine,U: 32.5 mg/dL
Marijuana Metabolite: NEGATIVE
Opiate Screen, Urine: NEGATIVE
Phencyclidine (PCP): NEGATIVE
Propoxyphene: NEGATIVE

## 2011-05-10 LAB — CBC
HCT: 36.7 % (ref 36.0–46.0)
Hemoglobin: 12.3 g/dL (ref 12.0–15.0)
MCHC: 33.5 g/dL (ref 30.0–36.0)
MCV: 89.1 fL (ref 78.0–100.0)
RDW: 14.2 % (ref 11.5–15.5)

## 2011-05-11 LAB — CBC
MCH: 30 pg (ref 26.0–34.0)
MCV: 89.2 fL (ref 78.0–100.0)
Platelets: 263 10*3/uL (ref 150–400)
RDW: 14.1 % (ref 11.5–15.5)

## 2011-05-17 NOTE — Cardiovascular Report (Signed)
NAME:  Sue Armstrong, Sue Armstrong NO.:  [id number]  MEDICAL RECORD NO.:  [id number]  LOCATION:                                 FACILITY:  PHYSICIAN:  Landry Corporal, MD DATE OF BIRTH:  April 10, 1961  DATE OF PROCEDURE:  05/09/2011 DATE OF DISCHARGE:                           CARDIAC CATHETERIZATION   PRIMARY CARE PROVIDER LAST KNOWN:  Asher Muir, MD, [facility].  PERFORMING PHYSICIAN:  Landry Corporal, MD  As of yet, the patient has no primary cardiologist.  PROCEDURE: 1. Left heart catheterization via 5-French right femoral artery     access. 2. Left ventriculogram in the RAO projection, 10 mL contrast for a     total of 30 mL. 3. Native coronary artery angiography.  INDICATIONS:  Non-ST-elevation MI by troponin.  BRIEF HISTORY:  Ms. Busker is a very pleasant 50 year old African American woman who has a history of left-sided stroke with no significant residual symptoms, hyperlipidemia, and a family history of diabetes who was admitted to [facility] with chest pain and positive troponin's concerning for non-ST- elevation MI.  Her symptoms are mostly shortness of breath and not exactly chest pain.  Did not notice significant amount of stress associated with this episode or social stressors, however.  But despite this, her troponins peaked to 12.4 and therefore, she is referred for diagnostic catheterization.  She has had a heart catheterization in 2005 which did not demonstrate any significant disease.  The risks, benefits, alternatives, and indications of the disease were explained to the patient in detail.  Informed consent was obtained with signed form placed on the chart.  PROCEDURE:  The patient was brought to Second [facility] Cardiac Catheterization lab.  Prepped and draped in usual sterile fashion for right femoral artery access.  After a time-out period was performed, the patient  was sedated with intravenous Versed and fentanyl.  The right femoral head was then localized using tactile fluoroscopic guidance and then the right groin was anesthetized using 1% subcutaneous lidocaine. The right femoral artery was then accessed using the modified Seldinger technique with the placement of 5-French sheath.  After the sheath was aspirated and flushed, first a 5-French JL-4 followed this 5-French JR-4 catheter was advanced over wire.  Multiple angiographic views of first the right and left coronary artery system were obtained.  first left-to- right coronary cirrhosis were obtained.  The R-4 was then exchanged for an angled pigtail catheter which was used to advance across the aortic valve, measuring left ventricular hemodynamics and then the catheter was pulled back across the aortic valve, measuring pullback gradient.  The catheter was then removed out of the body over wire without any complications.  The patient was stable for, during, and after the procedure.  ESTIMATED BLOOD LOSS:  Less than 10 mmHg.  CATHETERIZATION LABORATORY STATISTICS: 1. 1 mg of Versed, 25 mcg of fentanyl. 2. Contrast 81 mL.  HEMODYNAMIC RESULTS: 1. Central aortic pressure 138/76 mmHg with a mean of 97 mmHg. 2. Left ventricular pressure 138/60 mmHg, EDP 15 mmHg. 3. Ejection fraction of roughly 45% with global hypokinesis and at     least 2+  mitral resection was which was previously known.  There     may have been a slight apical wall motion abnormality but nothing     to suggest Takotsubo-type morphology.  ANGIOGRAPHIC FINDINGS: 1. The right coronary artery is a moderately enlarged vessel which     goes into the small posterior descending artery and posterolateral     system.  It is very diminutive in appearance. 2. Left main is angiographically normal, large-caliber vessel,     bifurcates into the LAD and circumflex. 3. The LAD is a large vessel, extends around the apex.  It gives rise      to 2 diagonal branch and at least one large septal trunk.  There is     no significant disease as the vessel bifurcates at the apex and     with no significant disease distally. 4. The circumflex is a large-caliber vessel.  It gives rise to a small     first obtuse marginal branch followed by a relatively small     atrioventricular groove branch and then continues on to another     obtuse marginal branch which has at least three branches itself.     One of smaller branches is a roughly 1-mm vessel.  It does look     like there may be a potentially thrombotic occlusion or kinking of     this vessel that did not appear to be significantly different from     the angiogram in 2005.  The rest of the circumflex had no     significant disease.  IMPRESSION: 1. No obvious culprit lesion for non-ST-elevation MI as even the     branch of an obtuse marginal that may potentially be occluded would     not necessarily explain a troponin elevation to that extent.     Either way, this is not amenable to PCI, but will be treated with     medical management. 2. Other potential etiology with a slight apical wall motion     abnormality could be a variant of Takotsubo cardiomyopathy. 3. There is a mildly decreased ejection fraction with known mild MR.  PLAN:  We will transfer the patient to telemetry if she is pain-free.  I will change her coverage from heparin to subcutaneous lidocaine for at least 48-72 hours for coverage.  Just on the off chance, there was potentially some small plaque rupture with distal embolization.  We will continue to optimize medical therapy and consider him for arterial spasm as one potential etiology, although the arteries themselves did not appear to be hyperresponsive or irritable.  Her primary care is at [facility].  Last known physician was Dr. Asher Muir.          ______________________________ Landry Corporal, MD     DWH/MEDQ  D:   05/11/2011  T:  05/12/2011  Job:  [id number]  cc:   [facility]  Electronically Signed by Bryan Lemma MD on 05/17/2011 02:33:24 PM

## 2011-05-21 NOTE — Discharge Summary (Signed)
NAME:  Sue Armstrong, Sue Armstrong NO.:  [id number]  MEDICAL RECORD NO.:  [id number]  LOCATION:  2922                         FACILITY:  [facility]  PHYSICIAN:  Landry Corporal, MD DATE OF BIRTH:  13-Jan-1961  DATE OF ADMISSION:  05/08/2011 DATE OF DISCHARGE:  05/11/2011                              DISCHARGE SUMMARY   DISCHARGE DIAGNOSES: 1. Non-ST-elevation myocardial infarction this admission with a peak     CK of 517, MB of 42, and a troponin of 13. 2. Coronary artery disease, Dr. Royann Shivers felt that there was a small     branch of the circumflex that was occluded suggestive of an embolic     infarct at catheterization but otherwise normal coronaries. 3. Normal left ventricular function by echocardiogram with an ejection     fraction of 45% at catheterization with global hypokinesis. 4. History of prior positive troponin with normal coronaries in 2005. 5. History of cerebrovascular accident December 2010 and May 2011. 6. Dyslipidemia, the patient has been noncompliant with medicines as     an outpatient because of cost.  HOSPITAL COURSE:  Sue Armstrong is a pleasant 50 year old African American female with history of chest pain and prior catheterization in 2005 with a positive troponin.  Drug screen was negative for cocaine at that time. The patient presented on May 09, 2011, with chest pain worrisome for unstable angina, see history and physical for complete details.  She also has a history of a left MCA CVA May 2011, and had had another CVA in December 2010, in [place].  She had been prescribed a statin, but was not taking this as an outpatient because of cost.  Her troponins were positive and peaked at 13.9.  CKs peaked at 517 with 42 MBs.  She was treated with heparin and nitrates, statin, beta-blocker, aspirin. She was taken to the cath lab on May 09, 2011.  A catheterization revealed patent coronaries, there was a suggestion of a small distal branch occlusion of  the circumflex.  She was transferred to the telemetry floor but had more pain and was transferred back to the CCU and put back on IV nitrates for 24 hours.  By May 10, 2011, she was painfree.  Her EKG at no time showed any ST elevation.  Her EKG is essentially normal.  Her films were reviewed by Dr. Royann Shivers as well as her echocardiogram.  Dr. Royann Shivers was somewhat concerned that she may have a hypercoagulable condition and lab work has been ordered and results are pending.  He reviewed her films and felt she possibly had an embolic occlusion of a distal circumflex branch.  He also notes two episodes of CVA and prior positive troponin with normal coronaries in 2005.  For now, she will be discharged on amlodipine, carvedilol, nitrates, and aspirin and statin.  Her medications have been changed to generic wherever possible and we changed her statin to Pravachol 40 mg a day which is 4 dollars at [employer].  She will follow up with Dr. Herbie Baltimore as an outpatient.  We will need to follow up her hypercoagulable labs.  LABORATORIES AT DISCHARGE:  White count 8.8, hemoglobin 12.8, hematocrit 38.0, platelets 263.  CKs peaked at 517 with 42 MB, cholesterol was 184, triglycerides 116, HDL 52, LDL 109.  TSH 2.37.  Drug screen is negative. Liver functions show an AST of 46, ALT of 16, hemoglobin A1c is 5.5. BNP was 143.  Sodium 140, potassium 3.7, BUN 12, creatinine 0.68.  EKG shows sinus rhythm without any acute changes.  Chest x-ray shows cardiomegaly and pulmonary venous hypertension.  DISPOSITION:  The patient is discharged in stable condition.  She will she will follow up with Dr. Herbie Baltimore as an outpatient.  She will need follow-up of her hypercoagulable panel.  Please see med rec for complete discharge medications.     Abelino Derrick, P.A.   ______________________________ Landry Corporal, MD    LKK/MEDQ  D:  05/11/2011  T:  05/11/2011  Job:  [id number]  Electronically Signed by Corine Shelter P.A. on 05/20/2011 11:56:44 AM Electronically Signed by Bryan Lemma MD on 05/21/2011 01:02:16 AM

## 2011-06-27 ENCOUNTER — Emergency Department (HOSPITAL_COMMUNITY)
Admission: EM | Admit: 2011-06-27 | Discharge: 2011-06-27 | Disposition: A | Discharge status: Home / Self Care | Payer: Self-pay | Attending: Emergency Medicine | Admitting: Emergency Medicine

## 2011-06-27 ENCOUNTER — Emergency Department (HOSPITAL_COMMUNITY): Payer: Self-pay

## 2011-06-27 DIAGNOSIS — R079 Chest pain, unspecified: Secondary | ICD-10-CM | POA: Insufficient documentation

## 2011-06-27 DIAGNOSIS — R0989 Other specified symptoms and signs involving the circulatory and respiratory systems: Secondary | ICD-10-CM | POA: Insufficient documentation

## 2011-06-27 DIAGNOSIS — Z8673 Personal history of transient ischemic attack (TIA), and cerebral infarction without residual deficits: Secondary | ICD-10-CM | POA: Insufficient documentation

## 2011-06-27 DIAGNOSIS — I252 Old myocardial infarction: Secondary | ICD-10-CM | POA: Insufficient documentation

## 2011-06-27 DIAGNOSIS — R0609 Other forms of dyspnea: Secondary | ICD-10-CM | POA: Insufficient documentation

## 2011-06-27 DIAGNOSIS — R0602 Shortness of breath: Secondary | ICD-10-CM | POA: Insufficient documentation

## 2011-06-27 DIAGNOSIS — I2510 Atherosclerotic heart disease of native coronary artery without angina pectoris: Secondary | ICD-10-CM | POA: Insufficient documentation

## 2011-06-27 LAB — CK TOTAL AND CKMB (NOT AT ARMC)
Relative Index: 1.5 (ref 0.0–2.5)
Total CK: 111 U/L (ref 7–177)

## 2011-06-27 LAB — POCT I-STAT TROPONIN I: Troponin i, poc: 0.01 ng/mL (ref 0.00–0.08)

## 2011-06-27 LAB — TROPONIN I: Troponin I: <0.3 ng/mL (ref <0.30)

## 2011-10-20 ENCOUNTER — Emergency Department (HOSPITAL_COMMUNITY)
Admission: EM | Admit: 2011-10-20 | Discharge: 2011-10-20 | Disposition: A | Discharge status: Home / Self Care | Payer: Self-pay | Attending: Emergency Medicine | Admitting: Emergency Medicine

## 2011-10-20 ENCOUNTER — Emergency Department (HOSPITAL_COMMUNITY): Payer: Self-pay

## 2011-10-20 ENCOUNTER — Encounter: Payer: Self-pay | Admitting: Emergency Medicine

## 2011-10-20 DIAGNOSIS — R0982 Postnasal drip: Secondary | ICD-10-CM | POA: Insufficient documentation

## 2011-10-20 DIAGNOSIS — B349 Viral infection, unspecified: Secondary | ICD-10-CM

## 2011-10-20 DIAGNOSIS — R059 Cough, unspecified: Secondary | ICD-10-CM | POA: Insufficient documentation

## 2011-10-20 DIAGNOSIS — R05 Cough: Secondary | ICD-10-CM | POA: Insufficient documentation

## 2011-10-20 DIAGNOSIS — B9789 Other viral agents as the cause of diseases classified elsewhere: Secondary | ICD-10-CM | POA: Insufficient documentation

## 2011-10-20 DIAGNOSIS — R51 Headache: Secondary | ICD-10-CM | POA: Insufficient documentation

## 2011-10-20 DIAGNOSIS — J029 Acute pharyngitis, unspecified: Secondary | ICD-10-CM | POA: Insufficient documentation

## 2011-10-20 MED ORDER — DEXTROMETHORPHAN-GUAIFENESIN 10-200 MG PO CAPS: 1.0000 | ORAL_CAPSULE | Freq: Three times a day (TID) | ORAL | Status: DC

## 2011-10-20 MED ORDER — ACETAMINOPHEN 325 MG PO TABS
650.0000 mg | ORAL_TABLET | Freq: Once | ORAL | Status: AC
  Administered 2011-10-20: 650 mg via ORAL
  Filled 2011-10-20: qty 2

## 2011-10-20 NOTE — ED Notes (Signed)
Coughing brown sputum, intermittent chest pain, and head x 1 1/2 weeks. 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 Som SOB, Sinus pressure, and bilateral ear pain (mainly right ear)for the same amount of time. Lung sounds clear. Heart sounds WNL. Will continue to monitor.

## 2011-10-20 NOTE — ED Provider Notes (Signed)
History     CSN: [id number] Arrival date & time: 10/20/2011  2:12 PM   First MD Initiated Contact with Patient 10/20/11 2031      Chief Complaint  Patient presents with  . Cough  . Sore Throat  . Headache    (Consider location/radiation/quality/duration/timing/severity/associated sxs/prior treatment) HPI Comments: This patient reports, cough, postnasal drip, ear congestion, for a, week.  She's been trying some over-the-counter TheraFlu without relief.  Cough is worse in certain positions like lying down or with exertion  Patient is a 50 y.o. female presenting with cough, pharyngitis, and headaches. The history is provided by the patient.  Cough This is a new problem. The current episode started more than 2 days ago. The problem occurs every few minutes. The problem has been gradually worsening. The cough is productive of brown sputum. There has been no fever. Associated symptoms include headaches. Pertinent negatives include no chest pain, no chills, no rhinorrhea, no sore throat and no wheezing. She has tried nothing for the symptoms. She is not a smoker.  Sore Throat Associated symptoms include coughing and headaches. Pertinent negatives include no chest pain, chills or sore throat.  Headache     Past Medical History  Diagnosis Date  . Asthma     No past surgical history on file.  No family history on file.  History  Substance Use Topics  . Smoking status: Never Smoker   . Smokeless tobacco: Not on file  . Alcohol Use: No    OB History    Grav Para Term Preterm Abortions TAB SAB Ect Mult Living                  Review of Systems  Constitutional: Negative for chills.  HENT: Negative for sore throat, rhinorrhea and sinus pressure.   Respiratory: Positive for cough. Negative for wheezing.   Cardiovascular: Negative for chest pain.  Gastrointestinal: Negative.   Genitourinary: Negative.   Musculoskeletal: Negative.   Skin: Negative.   Neurological: Positive  for headaches.  Hematological: Negative.   Psychiatric/Behavioral: Negative.     Allergies  Codeine  Home Medications   Current Outpatient Rx  Name Route Sig Dispense Refill  . AMLODIPINE BESYLATE 5 MG PO TABS Oral Take 5 mg by mouth daily.      . ASPIRIN 81 MG PO TBEC Oral Take 81 mg by mouth at bedtime.     . ISOSORBIDE MONONITRATE ER 30 MG PO TB24 Oral Take 30 mg by mouth daily.      Marland Kitchen METOPROLOL TARTRATE 25 MG PO TABS Oral Take 25 mg by mouth daily.      Marland Kitchen PRAVASTATIN SODIUM 40 MG PO TABS Oral Take 40 mg by mouth daily.      Marland Kitchen DEXTROMETHORPHAN-GUAIFENESIN 10-200 MG PO CAPS Oral Take 1 tablet by mouth 3 (three) times daily. 168 each 0    BP 136/62  Pulse 69  Temp(Src) 97.8 F (36.6 C) (Oral)  Resp 22  SpO2 98%  Physical Exam  Constitutional: She is oriented to person, place, and time. She appears well-developed and well-nourished.  HENT:  Head: Normocephalic.  Eyes: Pupils are equal, round, and reactive to light.  Neck: Normal range of motion.  Cardiovascular: Normal rate.   Pulmonary/Chest: Breath sounds normal. No respiratory distress. She has no wheezes. She exhibits no tenderness.  Musculoskeletal: Normal range of motion.  Neurological: She is oriented to person, place, and time.  Skin: Skin is warm and dry.    ED Course  Procedures (  including critical care time)  Labs Reviewed - No data to display Dg Chest 2 View  10/20/2011  *RADIOLOGY REPORT*  Clinical Data: Cough, chest congestion  CHEST - 2 VIEW  Comparison: 06/27/2011  Findings: Chronic interstitial markings.  No pleural effusion or pneumothorax.  The heart is top normal in size.  Mild degenerative changes of the visualized thoracolumbar spine.  IMPRESSION: No evidence of acute cardiopulmonary disease.  Original Report Authenticated By: Charline Bills, M.D.     1. Viral syndrome       MDM  Will obtain chest x-ray to rule out pneumonia, although this is unlikely.  This is most likely viral  syndrome        Arman Filter, NP 10/20/11 2036  Arman Filter, NP 10/20/11 [phone]

## 2011-10-20 NOTE — ED Notes (Signed)
Rx x 1, pt voiced understanding to f/u with PCP for further evaluation.

## 2011-10-22 NOTE — ED Provider Notes (Signed)
Medical screening examination/treatment/procedure(s) were performed by non-physician practitioner and as supervising physician I was immediately available for consultation/collaboration.  Trenia Tennyson, MD 10/22/11 0909 

## 2011-11-19 ENCOUNTER — Encounter (HOSPITAL_COMMUNITY): Payer: Self-pay | Admitting: *Deleted

## 2011-11-19 ENCOUNTER — Other Ambulatory Visit: Payer: Self-pay

## 2011-11-19 ENCOUNTER — Observation Stay (HOSPITAL_COMMUNITY)
Admission: EM | Admit: 2011-11-19 | Discharge: 2011-11-20 | Disposition: A | Discharge status: Home / Self Care | Payer: Self-pay | Attending: Family Medicine | Admitting: Family Medicine

## 2011-11-19 DIAGNOSIS — I10 Essential (primary) hypertension: Secondary | ICD-10-CM

## 2011-11-19 DIAGNOSIS — Z8673 Personal history of transient ischemic attack (TIA), and cerebral infarction without residual deficits: Secondary | ICD-10-CM | POA: Insufficient documentation

## 2011-11-19 DIAGNOSIS — E785 Hyperlipidemia, unspecified: Secondary | ICD-10-CM | POA: Insufficient documentation

## 2011-11-19 DIAGNOSIS — R0789 Other chest pain: Secondary | ICD-10-CM

## 2011-11-19 DIAGNOSIS — R0602 Shortness of breath: Secondary | ICD-10-CM | POA: Insufficient documentation

## 2011-11-19 DIAGNOSIS — J45909 Unspecified asthma, uncomplicated: Secondary | ICD-10-CM | POA: Insufficient documentation

## 2011-11-19 DIAGNOSIS — E669 Obesity, unspecified: Secondary | ICD-10-CM | POA: Insufficient documentation

## 2011-11-19 DIAGNOSIS — E876 Hypokalemia: Secondary | ICD-10-CM | POA: Insufficient documentation

## 2011-11-19 DIAGNOSIS — I252 Old myocardial infarction: Secondary | ICD-10-CM | POA: Insufficient documentation

## 2011-11-19 DIAGNOSIS — R079 Chest pain, unspecified: Principal | ICD-10-CM | POA: Insufficient documentation

## 2011-11-19 DIAGNOSIS — I959 Hypotension, unspecified: Secondary | ICD-10-CM | POA: Insufficient documentation

## 2011-11-19 DIAGNOSIS — Z23 Encounter for immunization: Secondary | ICD-10-CM | POA: Insufficient documentation

## 2011-11-19 DIAGNOSIS — K219 Gastro-esophageal reflux disease without esophagitis: Secondary | ICD-10-CM | POA: Insufficient documentation

## 2011-11-19 HISTORY — DX: Gastro-esophageal reflux disease without esophagitis: K21.9

## 2011-11-19 HISTORY — DX: Cerebral infarction, unspecified: I63.9

## 2011-11-19 HISTORY — DX: Dizziness and giddiness: R42

## 2011-11-19 NOTE — ED Notes (Signed)
Pt c/o L sided chest pain and sob x 3 days.  Reports nausea that just started today.

## 2011-11-20 ENCOUNTER — Encounter (HOSPITAL_COMMUNITY): Payer: Self-pay | Admitting: Cardiology

## 2011-11-20 ENCOUNTER — Emergency Department (HOSPITAL_COMMUNITY): Payer: Self-pay

## 2011-11-20 DIAGNOSIS — R0789 Other chest pain: Secondary | ICD-10-CM

## 2011-11-20 LAB — TSH: TSH: 2.399 u[IU]/mL (ref 0.350–4.500)

## 2011-11-20 LAB — BASIC METABOLIC PANEL
CO2: 26 mEq/L (ref 19–32)
Calcium: 9.3 mg/dL (ref 8.4–10.5)
Creatinine, Ser: 0.8 mg/dL (ref 0.50–1.10)

## 2011-11-20 LAB — POCT I-STAT, CHEM 8
BUN: 15 mg/dL (ref 6–23)
Creatinine, Ser: 0.9 mg/dL (ref 0.50–1.10)
Sodium: 145 mEq/L (ref 135–145)
TCO2: 26 mmol/L (ref 0–100)

## 2011-11-20 LAB — DIFFERENTIAL
Basophils Absolute: 0 10*3/uL (ref 0.0–0.1)
Basophils Relative: 1 % (ref 0–1)
Eosinophils Absolute: 0.1 10*3/uL (ref 0.0–0.7)
Eosinophils Relative: 2 % (ref 0–5)
Monocytes Absolute: 0.4 10*3/uL (ref 0.1–1.0)

## 2011-11-20 LAB — CBC
HCT: 36.6 % (ref 36.0–46.0)
HCT: 35.3 % — ABNORMAL LOW (ref 36.0–46.0)
Hemoglobin: 12.2 g/dL (ref 12.0–15.0)
MCHC: 33.3 g/dL (ref 30.0–36.0)
MCV: 90.1 fL (ref 78.0–100.0)
RDW: 14.4 % (ref 11.5–15.5)
WBC: 7.5 10*3/uL (ref 4.0–10.5)

## 2011-11-20 LAB — LIPID PANEL
HDL: 54 mg/dL (ref >39)
Total CHOL/HDL Ratio: 2.8 RATIO
VLDL: 12 mg/dL (ref 0–40)

## 2011-11-20 LAB — CREATININE, SERUM: GFR calc Af Amer: >90 mL/min (ref >90)

## 2011-11-20 LAB — TROPONIN I: Troponin I: <0.3 ng/mL (ref <0.30)

## 2011-11-20 LAB — HEMOGLOBIN A1C: Hgb A1c MFr Bld: 5.6 % (ref <5.7)

## 2011-11-20 LAB — CARDIAC PANEL(CRET KIN+CKTOT+MB+TROPI): Total CK: 142 U/L (ref 7–177)

## 2011-11-20 LAB — POCT I-STAT TROPONIN I

## 2011-11-20 MED ORDER — POTASSIUM CHLORIDE CRYS ER 20 MEQ PO TBCR
40.0000 meq | EXTENDED_RELEASE_TABLET | Freq: Once | ORAL | Status: AC
  Administered 2011-11-20: 40 meq via ORAL
  Filled 2011-11-20: qty 2

## 2011-11-20 MED ORDER — AMLODIPINE BESYLATE 5 MG PO TABS
5.0000 mg | ORAL_TABLET | Freq: Every day | ORAL | Status: DC
  Administered 2011-11-20: 5 mg via ORAL
  Filled 2011-11-20: qty 1

## 2011-11-20 MED ORDER — METOPROLOL TARTRATE 12.5 MG HALF TABLET
12.5000 mg | ORAL_TABLET | Freq: Every day | ORAL | Status: DC
  Administered 2011-11-20: 12.5 mg via ORAL
  Filled 2011-11-20: qty 1

## 2011-11-20 MED ORDER — SIMVASTATIN 20 MG PO TABS
20.0000 mg | ORAL_TABLET | Freq: Every day | ORAL | Status: DC
  Filled 2011-11-20: qty 1

## 2011-11-20 MED ORDER — INFLUENZA VIRUS VACC SPLIT PF IM SUSP: 0.5000 mL | INTRAMUSCULAR | Status: DC

## 2011-11-20 MED ORDER — ACETAMINOPHEN 325 MG PO TABS: 650.0000 mg | ORAL_TABLET | Freq: Four times a day (QID) | ORAL | Status: DC | PRN

## 2011-11-20 MED ORDER — INFLUENZA VIRUS VACC SPLIT PF IM SUSP
0.5000 mL | INTRAMUSCULAR | Status: AC
  Administered 2011-11-20: 0.5 mL via INTRAMUSCULAR
  Filled 2011-11-20: qty 0.5

## 2011-11-20 MED ORDER — HEPARIN SODIUM (PORCINE) 5000 UNIT/ML IJ SOLN: 5000.0000 [IU] | Freq: Three times a day (TID) | INTRAMUSCULAR | Status: DC

## 2011-11-20 MED ORDER — ACETAMINOPHEN 650 MG RE SUPP: 650.0000 mg | Freq: Four times a day (QID) | RECTAL | Status: DC | PRN

## 2011-11-20 MED ORDER — ISOSORBIDE MONONITRATE ER 30 MG PO TB24
30.0000 mg | ORAL_TABLET | Freq: Every day | ORAL | Status: DC
  Administered 2011-11-20: 30 mg via ORAL
  Filled 2011-11-20: qty 1

## 2011-11-20 MED ORDER — HEPARIN SODIUM (PORCINE) 5000 UNIT/ML IJ SOLN
5000.0000 [IU] | Freq: Three times a day (TID) | INTRAMUSCULAR | Status: DC
  Administered 2011-11-20: 5000 [IU] via SUBCUTANEOUS
  Filled 2011-11-20 (×4): qty 1

## 2011-11-20 MED ORDER — ASPIRIN EC 325 MG PO TBEC
325.0000 mg | DELAYED_RELEASE_TABLET | Freq: Every day | ORAL | Status: DC
  Administered 2011-11-20: 325 mg via ORAL
  Filled 2011-11-20: qty 1

## 2011-11-20 NOTE — Consult Note (Signed)
Reason for Consult: Chest Pain  Referring Physician:    [name] is an 51 y.o. female.  HPI:  The patient is a 51 year old African American woman with a history of admission to the hospital for chest pain.  On at least 2 occasions, she had positive troponins (13 during adm in July 2012) although both times had cardiac catheterizations which did not show any evidence of coronary artery disease and the etiology was thought to be coronary spasm.  Her history also includes CVA in 2010 and 2011 dyslipidemia, obesity.  Last night around 9:00pm, she experienced 7/10 sharp pain in the center of her chest that lasted a couple of minutes.  It did not radiate anywhere else.  She has never experienced this pain before. The pain was relieved with NTG which she took at home. Her daughter encouraged her to come to the ER because she didn't want to chance it.  Patient admits to palpitations during the time of the chest pain, noting that her "chest felt like is was beating out of control" and that she also felt it in her head.  She also experienced associated SOB, dizziness/lightheadedness and nausea but no vomiting.  Admits to "heartburn symptoms" in the past but no formal dx of reflux.  Denies diaphoresis.  Denies abdominal pain.  Denies hematuria and melena.  She reports a 10# weight loss since July 2012.  She states her eating habits have improved(fast food once per month) and she walks for 20 minutes five days per week.  She had one episode of CP this AM which was sharp and last five minutes.  Past Medical History  Diagnosis Date  . MI (myocardial infarction) 2012  . CVA (cerebral vascular accident) Dec 2010    no residual  . Dizziness   . GERD (gastroesophageal reflux disease)     Past Surgical History  Procedure Date  . Breast lumpectomy 51 years old    History reviewed. No pertinent family history.  Social History:  reports that she has never smoked. She does not have any smokeless tobacco  history on file. She reports that she does not drink alcohol or use illicit drugs.  Allergies:  Allergies  Allergen Reactions  . Codeine Itching    Medications:   . amLODipine  5 mg Oral Daily  . aspirin EC  325 mg Oral Daily  . heparin  5,000 Units Subcutaneous Q8H  . influenza  inactive virus vaccine  0.5 mL Intramuscular Tomorrow-1000  . isosorbide mononitrate  30 mg Oral Daily  . metoprolol tartrate  12.5 mg Oral Daily  . potassium chloride  40 mEq Oral Once  . simvastatin  20 mg Oral q1800  . DISCONTD: heparin  5,000 Units Subcutaneous Q8H    Results for orders placed during the hospital encounter of 11/19/11 (from the past 48 hour(s))  POCT I-STAT TROPONIN I     Status: Normal   Collection Time   11/20/11 12:07 AM      Component Value Range Comment   Troponin i, poc 0.01  0.00 - 0.08 (ng/mL)    Comment 3            POCT I-STAT, CHEM 8     Status: Abnormal   Collection Time   11/20/11 12:09 AM      Component Value Range Comment   Sodium 145  135 - 145 (mEq/L)    Potassium 3.5  3.5 - 5.1 (mEq/L)    Chloride 108  96 - 112 (mEq/L)  BUN 15  6 - 23 (mg/dL)    Creatinine, Ser 1.61  0.50 - 1.10 (mg/dL)    Glucose, Bld 096 (*) 70 - 99 (mg/dL)    Calcium, Ion 0.45  1.12 - 1.32 (mmol/L)    TCO2 26  0 - 100 (mmol/L)    Hemoglobin 12.9  12.0 - 15.0 (g/dL)    HCT 40.9  81.1 - 91.4 (%)   CBC     Status: Normal   Collection Time   11/20/11  1:13 AM      Component Value Range Comment   WBC 8.3  4.0 - 10.5 (K/uL)    RBC 4.02  3.87 - 5.11 (MIL/uL)    Hemoglobin 12.2  12.0 - 15.0 (g/dL)    HCT 78.2  95.6 - 21.3 (%)    MCV 91.0  78.0 - 100.0 (fL)    MCH 30.3  26.0 - 34.0 (pg)    MCHC 33.3  30.0 - 36.0 (g/dL)    RDW 08.6  57.8 - 46.9 (%)    Platelets 310  150 - 400 (K/uL)   DIFFERENTIAL     Status: Abnormal   Collection Time   11/20/11  1:13 AM      Component Value Range Comment   Neutrophils Relative 45  43 - 77 (%)    Neutro Abs 3.8  1.7 - 7.7 (K/uL)    Lymphocytes  Relative 49 (*) 12 - 46 (%)    Lymphs Abs 4.0  0.7 - 4.0 (K/uL)    Monocytes Relative 4  3 - 12 (%)    Monocytes Absolute 0.4  0.1 - 1.0 (K/uL)    Eosinophils Relative 2  0 - 5 (%)    Eosinophils Absolute 0.1  0.0 - 0.7 (K/uL)    Basophils Relative 1  0 - 1 (%)    Basophils Absolute 0.0  0.0 - 0.1 (K/uL)   BASIC METABOLIC PANEL     Status: Abnormal   Collection Time   11/20/11  1:13 AM      Component Value Range Comment   Sodium 143  135 - 145 (mEq/L)    Potassium 3.3 (*) 3.5 - 5.1 (mEq/L)    Chloride 106  96 - 112 (mEq/L)    CO2 26  19 - 32 (mEq/L)    Glucose, Bld 95  70 - 99 (mg/dL)    BUN 14  6 - 23 (mg/dL)    Creatinine, Ser 6.29  0.50 - 1.10 (mg/dL)    Calcium 9.3  8.4 - 10.5 (mg/dL)    GFR calc non Af Amer 84 (*) >90 (mL/min)    GFR calc Af Amer >90  >90 (mL/min)   CARDIAC PANEL(CRET KIN+CKTOT+MB+TROPI)     Status: Normal   Collection Time   11/20/11  1:13 AM      Component Value Range Comment   Total CK 142  7 - 177 (U/L)    CK, MB 1.9  0.3 - 4.0 (ng/mL)    Troponin I <0.30  <0.30 (ng/mL)    Relative Index 1.3  0.0 - 2.5    CBC     Status: Abnormal   Collection Time   11/20/11  4:03 AM      Component Value Range Comment   WBC 7.5  4.0 - 10.5 (K/uL)    RBC 3.92  3.87 - 5.11 (MIL/uL)    Hemoglobin 11.6 (*) 12.0 - 15.0 (g/dL)    HCT 52.8 (*) 41.3 - 46.0 (%)  MCV 90.1  78.0 - 100.0 (fL)    MCH 29.6  26.0 - 34.0 (pg)    MCHC 32.9  30.0 - 36.0 (g/dL)    RDW 96.0  45.4 - 09.8 (%)    Platelets 277  150 - 400 (K/uL)   CREATININE, SERUM     Status: Normal   Collection Time   11/20/11  4:03 AM      Component Value Range Comment   Creatinine, Ser 0.71  0.50 - 1.10 (mg/dL)    GFR calc non Af Amer >90  >90 (mL/min)    GFR calc Af Amer >90  >90 (mL/min)   TROPONIN I     Status: Normal   Collection Time   11/20/11  6:50 AM      Component Value Range Comment   Troponin I <0.30  <0.30 (ng/mL)     Dg Chest 2 View  11/20/2011  *RADIOLOGY REPORT*  Clinical Data: Chest pain   CHEST - 2 VIEW  Comparison:  10/20/2011  Findings:  The heart size and mediastinal contours are within normal limits.  Both lungs are clear.  The visualized skeletal structures are unremarkable.  IMPRESSION: No active cardiopulmonary disease.  Original Report Authenticated By: Judie Petit. Ruel Favors, M.D.    Review of Systems  Constitutional: Negative for diaphoresis.  HENT:       Frontal lobe, lateral to right eye.  Eyes: Negative for blurred vision and double vision.  Respiratory: Positive for shortness of breath.   Cardiovascular: Positive for chest pain and palpitations. Negative for orthopnea, leg swelling and PND.  Gastrointestinal: Positive for heartburn and nausea. Negative for vomiting, abdominal pain, blood in stool and melena.  Genitourinary: Negative for dysuria and hematuria.  Neurological: Positive for dizziness and headaches. Negative for focal weakness.   Blood pressure 96/62, pulse 58, temperature 97.6 F (36.4 C), temperature source Oral, resp. rate 20, height 5\' 4"  (1.626 m), weight 90.538 kg (199 lb 9.6 oz), SpO2 97.00%. Physical Exam  Constitutional: No distress.  HENT:  Head: Normocephalic and atraumatic.  Eyes: EOM are normal. Pupils are equal, round, and reactive to light. No scleral icterus.  Neck: Normal range of motion. Neck supple. No JVD present.       No carotid bruits  Cardiovascular: Regular rhythm.   No murmur heard.      Decreased rate, radial and dorsalis pedal pulses 2+ bilaterally  Respiratory: Effort normal and breath sounds normal. No respiratory distress. She has no wheezes. She has no rales.  GI: Soft. Bowel sounds are normal. There is no tenderness.  Musculoskeletal: She exhibits no edema.       No LEE  Lymphadenopathy:    She has no cervical adenopathy.  Neurological: She is alert. She exhibits normal muscle tone.       Cooperative  Skin: Skin is warm and dry. She is not diaphoretic.  Psychiatric: She has a normal mood and affect. Her behavior is  normal.    Assessment/Plan: 1. Chest pain 2. History of coronary vasospasm induced NSTEMI.  No evidence of CAD by cath. 3. Obesity 4. Dyslipidemia 5. Hypokalemia  Plan:  Trop I negative x1.  Will repeat.  EKG and telemetry NSR with decreased rate in the upper 50s.  Chest pain likely noncardiac .  Previous admission was for NSTEMI with trop of 13.0 and pt indicates pain was different then current presentation. She is on low dose lopressor and amlodipine.  Her BP/HR will not tolerate any further titration.  Discussed life-style  modification-diet and exercise at length to include weight lifting at the 'Y' to improve weight loss.  Dietary consult recommended.   HAGER,BRYAN W 11/20/2011, 12:04 PM   I have seen and examined the patient along with Wilburt Finlay, PA.  I have reviewed the chart, notes and new data.  I agree with Bryan's note.  Key new complaints: No further chest discomfort. Key examination changes: normal exam, no abnormalities on telemetry Key new findings / data: cardiac biomarkers negative  Sue Armstrong is an interesting woman with 2 episodes of aching substernal chest discomfort once in 2004 once this past summer both the document myocardial necrosis with troponins being positive and on both occasions no evidence of obstructive coronary disease noted. The most likely etiology here was myocardial injury due to coronary spasm. She is preserved cardiac function and has been doing relatively well since her MI in the summer of she does admit she notes every now and then waking up with a fluttering feeling in her chest that lasts less than a minute. She never had any episodes of prolonged palpitations or syncope near syncope. She's not had any heart failure symptoms of PND orthopnea edema or shortness of breath with exertion. Last night she had an episode, that began about half an hour after eating [employer] is that she describes as a sharp, stabbing-like sensation on the left side of her  chest but more prominent was affect she felt her heart was racing as she was running a race but not actually running. She did feel a little bit uneasy and jittery but not as issues the pass out. She did not notice any shortness of breath associated with this. She took a nitroglycerin about 10 minutes later symptoms got better, but however she also just recently taken her nighttime medications including metoprolol.   My impression here is that this episode is most likely not coronary artery spasm or occlusion in etiology. Quite likely is a high sensitivity with postprandial gastric bubble leading leading to diaphragmatic stimulation which could have caused her discomfort. Cannot exclude however the possibility of arrhythmia being the causative etiology either.  I have talked for quite a time with the patient and her family about her symptoms and what she thinks the symptoms were. She agrees that she doesn't think a sounds at all like her MI type anginal pain. He can't tell if her symptoms are relieved with nitroglycerin disease and is more prompt in relief of symptoms. He could well have been her standing metoprolol dose  eftaking ect in the setting of an arrhythmia.  Her occasional episodes at night with palpitations may well be related however they'll happen very frequently and I don't think wearing a monitor would be of any benefit this time. I told her that if she has anything similar to this that we will probably have her wear a monitor for a month.  Unfortunately her blood pressure is also not all that high which does not meet lateral for titrating her medications for coronary artery spasm either.  PLAN:   change metoprolol to 12.5 twice a day (she should check with pharmacy to see the difference between a couple of succinate metoprolol tartrate to see if there is once to daily dosing will be possible in which case I would recommend metoprolol succinate 12.5 mg daily    continue Imdur and  amlodipine, however we may need to back and Lovenox 2.5 mg daily based on her blood pressure.  Thank for negative cardiac biomarkers and  no further symptoms, that she is probably stable to be discharged home.  Schedule her to have a Myoview stress test prior to seeing me in followup the [facility].  Thank your for keep me apprised of this patient's admission.  It is a pleasure to assist in the care of this wonderful woman. I have discussed my recommendations with the family medicine house staff.  Marykay Lex, M.D., M.S. [facility] [address] [place], [place]  [address]  [phone]  11/20/2011 2:08 PM

## 2011-11-20 NOTE — Discharge Summary (Signed)
Family Medicine Teaching Service DISCHARGE SUMMARY   Patient name: Sue Armstrong Medical record number: [id number] Date of birth: 1961-08-15 Age: 51 y.o. Gender: female  Attending Physician: No att. providers found Primary Care Provider: Renold Don, MD, MD Consultants: cardiology  Dates of Hospitalization:  11/19/2011 to 11/20/2011 Length of Stay: 1 days  Admission Diagnoses:  Chest Pain  Discharge Diagnoses:  Active Problems:  Chest pain  Patient Active Problem List  Diagnoses Date Noted  . Chest pain 11/20/2011  . DYSLIPIDEMIA 03/21/2010  . CEREBROVASCULAR ACCIDENT 03/21/2010  . OBESITY, UNSPECIFIED 04/06/2008  . HYPOGLYCEMIA, HX OF 04/21/2007  . MIGRAINE, UNSPEC., W/O INTRACTABLE MIGRAINE 12/31/2006  . MENOPAUSAL SYNDROME 12/31/2006    Brief Hospital Course   DANNIELLA Armstrong is a 51 y.o. year old female who presented with acute onset of chest pain following a late night meal.  She reported substernal tightness and pressure that was relieved by nitroglycerin.  Unfortunately after taking her nitroglycerine she did demonstate dysarthria as well as generalized weakness but no signs of lateralization or other stroke like symptoms.  Upon evaluation in the ED she was found to have negative POC troponins and an unchanged EKG.  She was placed in observation overnight and CE were cycled.  Due to a history of NSTEMI and CVA her cardiologist was consulted.    Prior to discharge she was Chest Pain free, her CE were negative X3 and her neuro exam was normal without any signs of dysarthria or weakness.  Dr. Herbie Baltimore with Cardiology discussed the case and felt that she may benefit from adjustment of her medications but this could be pursued as an OP.  She was discharge in much improved condition and is to follow up with both her PCP and cardiology.  This episode of chest pain was felt to be non-cardiac in origin.     Significant Diagnostics:  Hematology:  Lab 11/20/11 0403 11/20/11 0113  11/20/11 0009  WBC 7.5 8.3 --  HGB 11.6* 12.2 12.9  HCT 35.3* 36.6 38.0  PLT 277 310 --  RDW 14.4 14.5 --  MCV 90.1 91.0 --  MCHC 32.9 33.3 --    Metabolic:  Lab 11/20/11 0403 11/20/11 0113 11/20/11 0009  NA -- 143 145  K -- 3.3* 3.5  CL -- 106 108  CO2 -- 26 --  BUN -- 14 15  CREATININE 0.71 0.80 0.90  GLUCOSE -- 95 102*  CALCIUM -- 9.3 --  MG -- -- --  PHOS -- -- --   Lab 11/20/11 0650  CHOL 150  TRIG 59  HDL 54    Lab 11/20/11 1115  TSH 2.399  T3FREE --  FREET4 --   Lab 11/20/11 1115  HGBA1C 5.6   Other Labs:  Lab 11/20/11 1115 11/20/11 0650 11/20/11 0113  CKTOTAL -- -- 142  TROPONINI <0.30 <0.30 <0.30  TROPONINT -- -- --  CKMBINDEX -- -- --   MICRO None  IMAGING: 11/20/11 CXR: No active cardiopulmonary disease  Procedures:   None  Discharge Vitals and Exam:  Vitals: Patient Vitals for the past 24 hrs:  BP Temp Temp src Pulse Resp SpO2 Height Weight  11/20/11 0959 96/62 mmHg - - 58  - - - -  11/20/11 0503 109/62 mmHg 97.6 F (36.4 C) Oral 55  20  97 % 5\' 4"  (1.626 m) 199 lb 9.6 oz (90.538 kg)  11/20/11 0415 104/69 mmHg - - 53  - 98 % - -  11/20/11 0330 100/63 mmHg - - 56  -  98 % - -  11/20/11 0245 93/62 mmHg - - 53  - 96 % - -  11/20/11 0230 99/55 mmHg - - 53  - 97 % - -  11/20/11 0217 97/56 mmHg - - 53  24  97 % - -  11/20/11 0115 97/52 mmHg - - 51  16  98 % - -  11/20/11 0100 101/57 mmHg - - 56  16  99 % - -  11/19/11 2320 103/64 mmHg 98.3 F (36.8 C) Oral 63  18  98 % - -   Wt Readings from Last 3 Encounters:  11/20/11 199 lb 9.6 oz (90.538 kg)  03/21/10 206 lb 1.6 oz (93.486 kg)  04/06/08 197 lb (89.359 kg)   No intake or output data in the 24 hours ending 11/20/11 1836  PE: GENERAL:  Adult AA female examined in [facility] 3700. In no discomfort.  No resp distress HNEENT: H&N: supple, no adenopathy, trachea midline and no carotid bruits OROPHARYNX: lips, mucosa, and tongue normal; teeth and gums normal EYES: conjunctivae/corneas  clear. PERRL, EOM's intact NOSE: normal THORAX: HEART: RRR, s1/s2 no murmur LUNGS: CTA B ABDOMEN:  +BS soft nontender, no organomegaly EXTREMITIES: moves all 4 extremities spontaneously, no lateralization, no edema 2+/4 DP&PT pulses >NEURO: No dysarthria, no facial droop or asymmetry, no lateralizaiton,2+/4 LE reflexes, UE and LE strength 5+/5 without lateralization   Discharge Information   Disposition: Home or Self Care Discharge Diet: Resume diet Discharge Condition:  Improved Discharge Activity: Ad lib Discharge Medication List as of 11/20/2011  3:31 PM    CONTINUE these medications which have NOT CHANGED   Details  amLODipine (NORVASC) 5 MG tablet Take 5 mg by mouth daily.  , Until Discontinued, Historical Med    aspirin (ASPIR-LOW) 81 MG EC tablet Take 81 mg by mouth at bedtime. , Until Discontinued, Historical Med    Dextromethorphan-Guaifenesin (CORICIDIN HBP CONGESTION/COUGH) 10-200 MG CAPS Take 1 tablet by mouth 3 (three) times daily., Starting 10/20/2011, Until Discontinued, Print    isosorbide mononitrate (IMDUR) 30 MG 24 hr tablet Take 30 mg by mouth daily.  , Until Discontinued, Historical Med    metoprolol tartrate (LOPRESSOR) 25 MG tablet Take 25 mg by mouth daily.  , Until Discontinued, Historical Med    nitroGLYCERIN (NITROSTAT) 0.6 MG SL tablet Place 0.6 mg under the tongue every 5 (five) minutes as needed. For chest pain, Until Discontinued, Historical Med    pravastatin (PRAVACHOL) 40 MG tablet Take 40 mg by mouth daily.  , Until Discontinued, Historical Med        Follow Up Issues and Recommendations:    Discharge Orders    Future Appointments: Provider: Department: Dept Phone: Center:   12/05/2011 9:45 AM Renold Don, MD [facility] Res[facility] [phone] [facility]     Follow-up Information    Follow up with [facility], MD on 12/05/2011. (@ 0945)         Pending Results: none  Follow up Issues: Medication adjustments per cardiology maximize management  of chronic disease management  Gaspar Bidding, DO [facility] Resident - PGY-1 11/20/2011 6:36 PM [facility] Intern Page: [phone]

## 2011-11-20 NOTE — H&P (Signed)
Sue Armstrong is an 51 y.o. female.   Chief Complaint: Chest Pain HPI: Sue Armstrong is a 51 year old woman who developed chest pain yesterday at 10 PM.  She notes that the pain was substernal nonradiating dull squeezing. He was not exacerbated by exertion relieved by rest. She took a nitroglycerin which did help a leave her chest pain but resulted in a headache. She notes some burping but no heartburn of sweating associated with this pain. She does note shortness of breath and palpitations with this pain.  Additionally her daughter reports some slurred speech and intermittent confusion following taking nitroglycerin tablets.  She is currently pain-free and having no neurologic symptoms.     Past Medical History  Diagnosis Date  . Asthma   NSTEMI in July 2012 with normal coronary catheterization  History reviewed. No pertinent past surgical history.  History reviewed. No pertinent family history. Social History:  reports that she has never smoked. She does not have any smokeless tobacco history on file. She reports that she does not drink alcohol or use illicit drugs.  Allergies:  Allergies  Allergen Reactions  . Codeine Itching    Medications Prior to Admission  Medication Dose Route Frequency Provider Last Rate Last Dose  . acetaminophen (TYLENOL) tablet 650 mg  650 mg Oral Q6H PRN Clementeen Graham, MD       Or  . acetaminophen (TYLENOL) suppository 650 mg  650 mg Rectal Q6H PRN Clementeen Graham, MD      . heparin injection 5,000 Units  5,000 Units Subcutaneous Q8H Clementeen Graham, MD       Medications Prior to Admission  Medication Sig Dispense Refill  . amLODipine (NORVASC) 5 MG tablet Take 5 mg by mouth daily.        Marland Kitchen aspirin (ASPIR-LOW) 81 MG EC tablet Take 81 mg by mouth at bedtime.       Marland Kitchen Dextromethorphan-Guaifenesin (CORICIDIN HBP CONGESTION/COUGH) 10-200 MG CAPS Take 1 tablet by mouth 3 (three) times daily.  168 each  0  . isosorbide mononitrate (IMDUR) 30 MG 24 hr tablet Take 30 mg by  mouth daily.        . metoprolol tartrate (LOPRESSOR) 25 MG tablet Take 25 mg by mouth daily.        . pravastatin (PRAVACHOL) 40 MG tablet Take 40 mg by mouth daily.           ROS reviewed please see HPI for further details.   Exam:  Blood pressure 104/69, pulse 53, temperature 98.3 F (36.8 C), temperature source Oral, resp. rate 24, SpO2 98.00%. Gen: Well NAD HEENT: EOMI,  MMM Lungs: CTABL Nl WOB Heart: RRR no MRG Abd: NABS, NT, ND Exts: Non edematous BL  LE, warm and well perfused.  Neuro: AOx3 CN 2-12 normal, Sensation intact, Strength is normal BL, Reflex 2/2 and = BL, FNF normal, and Rhomberg normal   Results for orders placed during the hospital encounter of 11/19/11 (from the past 48 hour(s))  POCT I-STAT TROPONIN I     Status: Normal   Collection Time   11/20/11 12:07 AM      Component Value Range Comment   Troponin i, poc 0.01  0.00 - 0.08 (ng/mL)    Comment 3            POCT I-STAT, CHEM 8     Status: Abnormal   Collection Time   11/20/11 12:09 AM      Component Value Range Comment   Sodium 145  135 -  145 (mEq/L)    Potassium 3.5  3.5 - 5.1 (mEq/L)    Chloride 108  96 - 112 (mEq/L)    BUN 15  6 - 23 (mg/dL)    Creatinine, Ser 1.61  0.50 - 1.10 (mg/dL)    Glucose, Bld 096 (*) 70 - 99 (mg/dL)    Calcium, Ion 0.45  1.12 - 1.32 (mmol/L)    TCO2 26  0 - 100 (mmol/L)    Hemoglobin 12.9  12.0 - 15.0 (g/dL)    HCT 40.9  81.1 - 91.4 (%)   CBC     Status: Normal   Collection Time   11/20/11  1:13 AM      Component Value Range Comment   WBC 8.3  4.0 - 10.5 (K/uL)    RBC 4.02  3.87 - 5.11 (MIL/uL)    Hemoglobin 12.2  12.0 - 15.0 (g/dL)    HCT 78.2  95.6 - 21.3 (%)    MCV 91.0  78.0 - 100.0 (fL)    MCH 30.3  26.0 - 34.0 (pg)    MCHC 33.3  30.0 - 36.0 (g/dL)    RDW 08.6  57.8 - 46.9 (%)    Platelets 310  150 - 400 (K/uL)   DIFFERENTIAL     Status: Abnormal   Collection Time   11/20/11  1:13 AM      Component Value Range Comment   Neutrophils Relative 45  43 - 77 (%)     Neutro Abs 3.8  1.7 - 7.7 (K/uL)    Lymphocytes Relative 49 (*) 12 - 46 (%)    Lymphs Abs 4.0  0.7 - 4.0 (K/uL)    Monocytes Relative 4  3 - 12 (%)    Monocytes Absolute 0.4  0.1 - 1.0 (K/uL)    Eosinophils Relative 2  0 - 5 (%)    Eosinophils Absolute 0.1  0.0 - 0.7 (K/uL)    Basophils Relative 1  0 - 1 (%)    Basophils Absolute 0.0  0.0 - 0.1 (K/uL)   BASIC METABOLIC PANEL     Status: Abnormal   Collection Time   11/20/11  1:13 AM      Component Value Range Comment   Sodium 143  135 - 145 (mEq/L)    Potassium 3.3 (*) 3.5 - 5.1 (mEq/L)    Chloride 106  96 - 112 (mEq/L)    CO2 26  19 - 32 (mEq/L)    Glucose, Bld 95  70 - 99 (mg/dL)    BUN 14  6 - 23 (mg/dL)    Creatinine, Ser 6.29  0.50 - 1.10 (mg/dL)    Calcium 9.3  8.4 - 10.5 (mg/dL)    GFR calc non Af Amer 84 (*) >90 (mL/min)    GFR calc Af Amer >90  >90 (mL/min)   CARDIAC PANEL(CRET KIN+CKTOT+MB+TROPI)     Status: Normal   Collection Time   11/20/11  1:13 AM      Component Value Range Comment   Total CK 142  7 - 177 (U/L)    CK, MB 1.9  0.3 - 4.0 (ng/mL)    Troponin I <0.30  <0.30 (ng/mL)    Relative Index 1.3  0.0 - 2.5     Dg Chest 2 View  11/20/2011  *RADIOLOGY REPORT*  Clinical Data: Chest pain  CHEST - 2 VIEW  Comparison:  10/20/2011  Findings:  The heart size and mediastinal contours are within normal limits.  Both lungs are clear.  The visualized skeletal structures are unremarkable.  IMPRESSION: No active cardiopulmonary disease.  Original Report Authenticated By: Judie Petit. Ruel Favors, M.D.    Assessment/Plan 51 year old woman with a history of NSTEMI he with clean coronary arteries who presents with substernal chest pain tonight. 1) chest pain: Obviously concerning for acute coronary syndrome. However her troponin is negative and she does have a recent normal cardiac catheterization. Plan to admit patient to telemetry unit and cycle cardiac enzymes. We'll follow closely 2) slurred speech: Normal neuro exam nail and  patient has a recent workup for TIA symptoms. I suspect that she had hypotension following nitroglycerin which exacerbated her usual hypotension resulting in some reversible decreased perfusion to her brain. I feel that we will likely not learn much from a repeat TIA workup at this point. We'll discuss with the team. 3) hypotension: On isosorbide and beta blocker for NSTEMI history.  We'll continue these blood pressure medications as she has been taking them. And they are important for her overall mortality benefit. 4) FENGI: Family block and heart healthy diet 5) prophylaxis: Heparin 6) disposition: Likely to home following cardiac rule out  COREY,EVAN 11/20/2011, 4:26 AM

## 2011-11-20 NOTE — ED Notes (Signed)
Patient transported to X-ray 

## 2011-11-20 NOTE — H&P (Signed)
[facility] Attending Admission Note  Patient seen and examined by me, discussed with resident team and I agree with assessment/plan. Briefly, a 51yoF with prior history NSTEMI July 2012 and cardiac cath at time with no evidence of occlusive disease, also with reported CVA history 2004, 2005 and negative hypercoagulable workup in July 2012, who presents to ED for complaint of sudden onset L sided chest pain around 1030PM on 01/16.  Pain accompanied by nausea without emesis; had some belching which improved the pain.  Reports last meal prior to pain was [employer] at 945pm.  Denies dyspnea associated, did have some generalized weakness.  Was given sl NTG and reports feeling very weak following, with transient episode of slurred speech which was short-lived and has not recurred.  Denies any focal motor weakness associated with this episode of illness.  This morning she reports feeling back to her baseline and ready to go home.  Workup thus far with negative cardiac enzymes and negative ECG.  ROS Recently with upper respiratory infection 2 weeks ago, just recently resolved.  Chest pain did not change with position.  Social Hx; She denies any smoking or alcohol history; patient interviewed alone (family dismissed from room) and denies any cocaine use or history.   Exam Well appearing, no apparent distress HEENT Neck supple. Speech fluid.  COR S1S2, no chest wall tenderness with palpation.  No exacerbated with active ROM testing of L shoulder.  PULM Clear bilaterally  ABD Soft, nontender, nondistended NEURO Full strength UE and LE symmetric; grossly intact sensation; no ankle clonus bilaterally.   Assess/Plan: Patient is a 51 yo F with prior NSTEMI, admitted for complaint of L sided chest pain that has resolved.  She had no observable CAD lesion on cath in July 2012 following NSTEMI.  Concern for possibility of non-occlusive angina such as Prinzmetal's angina given history.  She denies any history of cocaine use.   Will contact her cardiologists at [facility] for input in light of her history.  Would consider non-cardiac causes such as GI/GERD as being more likely.  Paula Compton, MD

## 2011-11-20 NOTE — ED Notes (Signed)
PT has hx of MI May 08, 2011. Reports pain does not feel the way it did during that. Reports tightness and nausea.

## 2011-11-20 NOTE — ED Provider Notes (Signed)
History     CSN: [id number]  Arrival date & time 11/19/11  2310   First MD Initiated Contact with Patient 11/20/11 0053      Chief Complaint  Patient presents with  . Chest Pain    on arrival daughter stated, "i think my mom is having a heart attack".    (Consider location/radiation/quality/duration/timing/severity/associated sxs/prior treatment) Patient is a 51 y.o. female presenting with chest pain. The history is provided by the patient.  Chest Pain The chest pain began 3 - 5 days ago. Chest pain occurs intermittently. The chest pain is unchanged. Associated with: nothing. At its most intense, the pain is at 5/10. The pain is currently at 0/10. The severity of the pain is moderate. The quality of the pain is described as aching and tightness. Chest pain is worsened by deep breathing. Primary symptoms include shortness of breath. Pertinent negatives for primary symptoms include no cough, no wheezing, no palpitations, no abdominal pain, no nausea and no vomiting.  Pertinent negatives for associated symptoms include no lower extremity edema, no numbness and no weakness. She tried aspirin and nitroglycerin for the symptoms.  Her past medical history is significant for MI.  Pertinent negatives for past medical history include no CAD. Past medical history comments: Patient had an NSTEMI in July with an unrevealing cath.  New stents placed  Procedure history is positive for cardiac catheterization.     Past Medical History  Diagnosis Date  . Asthma     History reviewed. No pertinent past surgical history.  History reviewed. No pertinent family history.  History  Substance Use Topics  . Smoking status: Never Smoker   . Smokeless tobacco: Not on file  . Alcohol Use: No    OB History    Grav Para Term Preterm Abortions TAB SAB Ect Mult Living                  Review of Systems  Respiratory: Positive for shortness of breath. Negative for cough and wheezing.   Cardiovascular:  Positive for chest pain. Negative for palpitations.  Gastrointestinal: Negative for nausea, vomiting and abdominal pain.  Neurological: Positive for light-headedness. Negative for weakness and numbness.       After she took a nitroglycerin for the chest pain she states she felt weak all over and had some slurred speech  All other systems reviewed and are negative.    Allergies  Codeine  Home Medications   Current Outpatient Rx  Name Route Sig Dispense Refill  . AMLODIPINE BESYLATE 5 MG PO TABS Oral Take 5 mg by mouth daily.      . ASPIRIN 81 MG PO TBEC Oral Take 81 mg by mouth at bedtime.     Marland Kitchen DEXTROMETHORPHAN-GUAIFENESIN 10-200 MG PO CAPS Oral Take 1 tablet by mouth 3 (three) times daily. 168 each 0  . ISOSORBIDE MONONITRATE ER 30 MG PO TB24 Oral Take 30 mg by mouth daily.      Marland Kitchen METOPROLOL TARTRATE 25 MG PO TABS Oral Take 25 mg by mouth daily.      Marland Kitchen NITROGLYCERIN 0.6 MG SL SUBL Sublingual Place 0.6 mg under the tongue every 5 (five) minutes as needed. For chest pain    . PRAVASTATIN SODIUM 40 MG PO TABS Oral Take 40 mg by mouth daily.        BP 93/62  Pulse 53  Temp(Src) 98.3 F (36.8 C) (Oral)  Resp 24  SpO2 96%  Physical Exam  Nursing note and vitals reviewed.  Constitutional: She is oriented to person, place, and time. She appears well-developed and well-nourished. No distress.  HENT:  Head: Normocephalic and atraumatic.  Eyes: EOM are normal. Pupils are equal, round, and reactive to light.  Neck: Normal range of motion. Neck supple.  Cardiovascular: Normal rate, regular rhythm, normal heart sounds and intact distal pulses.  Exam reveals no friction rub.   No murmur heard. Pulmonary/Chest: Effort normal and breath sounds normal. She has no wheezes. She has no rales. She exhibits no tenderness.  Abdominal: Soft. Bowel sounds are normal. She exhibits no distension. There is no tenderness. There is no rebound and no guarding.  Musculoskeletal: Normal range of motion. She  exhibits no tenderness.       No edema  Neurological: She is alert and oriented to person, place, and time. No cranial nerve deficit.  Skin: Skin is warm and dry. No rash noted.  Psychiatric: She has a normal mood and affect. Her behavior is normal.    ED Course  Procedures (including critical care time)  Labs Reviewed  POCT I-STAT, CHEM 8 - Abnormal; Notable for the following:    Glucose, Bld 102 (*)    All other components within normal limits  DIFFERENTIAL - Abnormal; Notable for the following:    Lymphocytes Relative 49 (*)    All other components within normal limits  BASIC METABOLIC PANEL - Abnormal; Notable for the following:    Potassium 3.3 (*)    GFR calc non Af Amer 84 (*)    All other components within normal limits  CBC - Abnormal; Notable for the following:    Hemoglobin 11.6 (*)    HCT 35.3 (*)    All other components within normal limits  POCT I-STAT TROPONIN I  CBC  CARDIAC PANEL(CRET KIN+CKTOT+MB+TROPI)  CREATININE, SERUM  I-STAT, CHEM 8  I-STAT TROPONIN I  TROPONIN I  TROPONIN I   Dg Chest 2 View  11/20/2011  *RADIOLOGY REPORT*  Clinical Data: Chest pain  CHEST - 2 VIEW  Comparison:  10/20/2011  Findings:  The heart size and mediastinal contours are within normal limits.  Both lungs are clear.  The visualized skeletal structures are unremarkable.  IMPRESSION: No active cardiopulmonary disease.  Original Report Authenticated By: Judie Petit. Ruel Favors, M.D.    Date: 11/20/2011  Rate: 57  Rhythm: sinus bradycardia  QRS Axis: normal  Intervals: normal  ST/T Wave abnormalities: normal  Conduction Disutrbances:none  Narrative Interpretation:   Old EKG Reviewed: unchanged    1. Chest pain       MDM   The patient presented within one hour after developing a severe episode of chest pain tonight for which she took nitroglycerin. Nitroglycerin relieved her chest pain but when she took it she developed weakness and some slurred speech. Here patient has a low  blood pressure of 103/64 and feel most likely what happened is when she took the nitroglycerin her blood pressure dropped causing the above symptoms which had resolved by the time she got here. On arrival here her chest pain had resolved. In July she states she had similar symptoms but they were worse and at that time she had an NSTEMI with a peak troponin of 12. At that time she had a cardiac catheterization which showed no significant disease and a small torturous vessels which could be the cause of her symptoms. EKG, chest x-ray, CBC, BMP, cardiac enzymes all within normal limits today. Symptoms are not suggestive of PE, infection or other cause. Will admit  patient to family medicine for rule out given her above history and the fact that she came in within an hour of her symptom onset.       Gwyneth Sprout, MD 11/20/11 [phone]

## 2011-11-21 NOTE — Discharge Summary (Signed)
I discussed with Dr Rigby.  I agree with their plans documented in their  Note for today.  

## 2011-12-05 ENCOUNTER — Ambulatory Visit: Payer: Self-pay | Admitting: Family Medicine

## 2013-09-15 ENCOUNTER — Encounter (HOSPITAL_COMMUNITY): Payer: Self-pay | Admitting: Emergency Medicine

## 2013-09-15 ENCOUNTER — Emergency Department (INDEPENDENT_AMBULATORY_CARE_PROVIDER_SITE_OTHER)
Admission: EM | Admit: 2013-09-15 | Discharge: 2013-09-15 | Disposition: A | Discharge status: Hospice | Payer: Self-pay | Source: Home / Self Care | Attending: Emergency Medicine | Admitting: Emergency Medicine

## 2013-09-15 ENCOUNTER — Observation Stay (HOSPITAL_COMMUNITY)
Admission: EM | Admit: 2013-09-15 | Discharge: 2013-09-16 | Disposition: A | Discharge status: Home / Self Care | Payer: Self-pay | Attending: Internal Medicine | Admitting: Internal Medicine

## 2013-09-15 ENCOUNTER — Emergency Department (INDEPENDENT_AMBULATORY_CARE_PROVIDER_SITE_OTHER): Payer: Self-pay

## 2013-09-15 ENCOUNTER — Emergency Department (HOSPITAL_COMMUNITY): Payer: Self-pay

## 2013-09-15 DIAGNOSIS — I252 Old myocardial infarction: Secondary | ICD-10-CM | POA: Insufficient documentation

## 2013-09-15 DIAGNOSIS — Z7982 Long term (current) use of aspirin: Secondary | ICD-10-CM | POA: Insufficient documentation

## 2013-09-15 DIAGNOSIS — N951 Menopausal and female climacteric states: Secondary | ICD-10-CM

## 2013-09-15 DIAGNOSIS — Z9889 Other specified postprocedural states: Secondary | ICD-10-CM | POA: Insufficient documentation

## 2013-09-15 DIAGNOSIS — K219 Gastro-esophageal reflux disease without esophagitis: Secondary | ICD-10-CM | POA: Insufficient documentation

## 2013-09-15 DIAGNOSIS — Z8673 Personal history of transient ischemic attack (TIA), and cerebral infarction without residual deficits: Secondary | ICD-10-CM | POA: Insufficient documentation

## 2013-09-15 DIAGNOSIS — R0789 Other chest pain: Principal | ICD-10-CM

## 2013-09-15 DIAGNOSIS — M129 Arthropathy, unspecified: Secondary | ICD-10-CM | POA: Insufficient documentation

## 2013-09-15 DIAGNOSIS — I6350 Cerebral infarction due to unspecified occlusion or stenosis of unspecified cerebral artery: Secondary | ICD-10-CM

## 2013-09-15 DIAGNOSIS — R059 Cough, unspecified: Secondary | ICD-10-CM | POA: Insufficient documentation

## 2013-09-15 DIAGNOSIS — R05 Cough: Secondary | ICD-10-CM | POA: Insufficient documentation

## 2013-09-15 DIAGNOSIS — Z885 Allergy status to narcotic agent status: Secondary | ICD-10-CM | POA: Insufficient documentation

## 2013-09-15 DIAGNOSIS — G43909 Migraine, unspecified, not intractable, without status migrainosus: Secondary | ICD-10-CM

## 2013-09-15 DIAGNOSIS — Z862 Personal history of diseases of the blood and blood-forming organs and certain disorders involving the immune mechanism: Secondary | ICD-10-CM

## 2013-09-15 DIAGNOSIS — E669 Obesity, unspecified: Secondary | ICD-10-CM

## 2013-09-15 DIAGNOSIS — Z79899 Other long term (current) drug therapy: Secondary | ICD-10-CM | POA: Insufficient documentation

## 2013-09-15 DIAGNOSIS — E785 Hyperlipidemia, unspecified: Secondary | ICD-10-CM

## 2013-09-15 DIAGNOSIS — E7800 Pure hypercholesterolemia, unspecified: Secondary | ICD-10-CM | POA: Insufficient documentation

## 2013-09-15 HISTORY — DX: Unspecified osteoarthritis, unspecified site: M19.90

## 2013-09-15 HISTORY — DX: Migraine, unspecified, not intractable, without status migrainosus: G43.909

## 2013-09-15 HISTORY — DX: Pure hypercholesterolemia, unspecified: E78.00

## 2013-09-15 LAB — BASIC METABOLIC PANEL
BUN: 13 mg/dL (ref 6–23)
Calcium: 9.1 mg/dL (ref 8.4–10.5)
Creatinine, Ser: 0.82 mg/dL (ref 0.50–1.10)
GFR calc Af Amer: >90 mL/min (ref >90)
GFR calc non Af Amer: 80 mL/min — ABNORMAL LOW (ref >90)

## 2013-09-15 LAB — CBC WITH DIFFERENTIAL/PLATELET
Basophils Relative: 0 % (ref 0–1)
Eosinophils Absolute: 0.1 10*3/uL (ref 0.0–0.7)
HCT: 40.3 % (ref 36.0–46.0)
Hemoglobin: 13.4 g/dL (ref 12.0–15.0)
MCH: 30.3 pg (ref 26.0–34.0)
MCHC: 33.3 g/dL (ref 30.0–36.0)
Monocytes Absolute: 0.4 10*3/uL (ref 0.1–1.0)
Monocytes Relative: 5 % (ref 3–12)
Neutrophils Relative %: 50 % (ref 43–77)
RDW: 14.4 % (ref 11.5–15.5)

## 2013-09-15 LAB — CREATININE, SERUM: GFR calc Af Amer: >90 mL/min (ref >90)

## 2013-09-15 LAB — CBC
HCT: 37.4 % (ref 36.0–46.0)
Hemoglobin: 12.3 g/dL (ref 12.0–15.0)
MCH: 29.9 pg (ref 26.0–34.0)
MCV: 91 fL (ref 78.0–100.0)
RBC: 4.11 MIL/uL (ref 3.87–5.11)
WBC: 7.8 10*3/uL (ref 4.0–10.5)

## 2013-09-15 LAB — TROPONIN I: Troponin I: <0.3 ng/mL (ref <0.30)

## 2013-09-15 MED ORDER — GI COCKTAIL ~~LOC~~
30.0000 mL | Freq: Four times a day (QID) | ORAL | Status: DC | PRN
  Filled 2013-09-15: qty 30

## 2013-09-15 MED ORDER — ONDANSETRON HCL 4 MG/2ML IJ SOLN: 4.0000 mg | Freq: Four times a day (QID) | INTRAMUSCULAR | Status: DC | PRN

## 2013-09-15 MED ORDER — ACETAMINOPHEN 500 MG PO TABS
1000.0000 mg | ORAL_TABLET | Freq: Once | ORAL | Status: AC
  Administered 2013-09-15: 1000 mg via ORAL
  Filled 2013-09-15: qty 2

## 2013-09-15 MED ORDER — ASPIRIN EC 325 MG PO TBEC: 325.0000 mg | DELAYED_RELEASE_TABLET | Freq: Every day | ORAL | Status: DC

## 2013-09-15 MED ORDER — ACETAMINOPHEN 325 MG PO TABS
650.0000 mg | ORAL_TABLET | ORAL | Status: DC | PRN
  Administered 2013-09-15: 650 mg via ORAL
  Filled 2013-09-15: qty 2

## 2013-09-15 MED ORDER — ASPIRIN EC 325 MG PO TBEC
325.0000 mg | DELAYED_RELEASE_TABLET | Freq: Every day | ORAL | Status: DC
  Administered 2013-09-16: 325 mg via ORAL
  Filled 2013-09-15: qty 1

## 2013-09-15 MED ORDER — NITROGLYCERIN 0.4 MG SL SUBL
0.4000 mg | SUBLINGUAL_TABLET | SUBLINGUAL | Status: AC | PRN
  Administered 2013-09-15 (×3): 0.4 mg via SUBLINGUAL
  Filled 2013-09-15: qty 25

## 2013-09-15 MED ORDER — ASPIRIN 81 MG PO CHEW
324.0000 mg | CHEWABLE_TABLET | Freq: Once | ORAL | Status: AC
  Administered 2013-09-15: 324 mg via ORAL
  Filled 2013-09-15: qty 4

## 2013-09-15 MED ORDER — HEPARIN SODIUM (PORCINE) 5000 UNIT/ML IJ SOLN
5000.0000 [IU] | Freq: Three times a day (TID) | INTRAMUSCULAR | Status: DC
  Administered 2013-09-15 – 2013-09-16 (×2): 5000 [IU] via SUBCUTANEOUS
  Filled 2013-09-15 (×5): qty 1

## 2013-09-15 NOTE — ED Provider Notes (Signed)
CSN: [id number]     Arrival date & time 09/15/13  3086 History   None    Chief Complaint  Patient presents with  . Chest Pain  . Cough   (Consider location/radiation/quality/duration/timing/severity/associated sxs/prior Treatment) HPI Comments: 52 year old female with previous history of MI in 2011 presents complaining of chest wall pain and subjective mild shortness of breath. She feels like it is hard to breathe because it exacerbates her pain. Pain is located on the left side underneath the breast. The pain is exacerbated only by coughing, and is not exacerbated by any walking or physical activity. Has not affected her ability to exercise over the past few days and she continues to walk daily. This began on Wednesday and has been constant. She did not have any injury. She does not have any leg swelling or recent travel. She had a bit of nausea on Tuesday but nothing since then, and no vomiting. The pain is localized and does not radiate. There has not been any dizziness or lightheadedness and no diaphoresis. She was previously prescribed antihypertensives, isosorbide mononitrate, and pravastatin, but she has not taken these since January because she lost her insurance. She also has a history of stroke in 2012. She has no recent weight loss or night sweats. She has never had a mammogram but does perform self breast exams and has not felt any sort of lump. There is no redness in the breast and no nipple discharge. No history of DVT or PE. She does not smoke. He does not take any hormonal medications. There is no family history of any clotting disorders.  Patient is a 52 y.o. female presenting with chest pain and cough.  Chest Pain Associated symptoms: cough and shortness of breath   Associated symptoms: no abdominal pain, no dizziness, no fever, no nausea, no palpitations, not vomiting and no weakness   Cough Associated symptoms: chest pain and shortness of breath   Associated symptoms: no chills,  no fever, no myalgias and no rash     Past Medical History  Diagnosis Date  . MI (myocardial infarction) 2012  . CVA (cerebral vascular accident) Dec 2010    no residual  . Dizziness   . GERD (gastroesophageal reflux disease)    Past Surgical History  Procedure Laterality Date  . Breast lumpectomy  52 years old   History reviewed. No pertinent family history. History  Substance Use Topics  . Smoking status: Never Smoker   . Smokeless tobacco: Not on file  . Alcohol Use: No   OB History   Grav Para Term Preterm Abortions TAB SAB Ect Mult Living                 Review of Systems  Constitutional: Negative for fever and chills.  Eyes: Negative for visual disturbance.  Respiratory: Positive for cough and shortness of breath.   Cardiovascular: Positive for chest pain. Negative for palpitations and leg swelling.  Gastrointestinal: Negative for nausea, vomiting and abdominal pain.  Endocrine: Negative for polydipsia and polyuria.  Genitourinary: Negative for dysuria, urgency and frequency.  Musculoskeletal: Negative for arthralgias and myalgias.  Skin: Negative for rash.  Neurological: Negative for dizziness, weakness and light-headedness.    Allergies  Codeine  Home Medications   Current Outpatient Rx  Name  Route  Sig  Dispense  Refill  . amLODipine (NORVASC) 5 MG tablet   Oral   Take 5 mg by mouth daily.           Marland Kitchen  aspirin (ASPIR-LOW) 81 MG EC tablet   Oral   Take 81 mg by mouth at bedtime.          Marland Kitchen Dextromethorphan-Guaifenesin (CORICIDIN HBP CONGESTION/COUGH) 10-200 MG CAPS   Oral   Take 1 tablet by mouth 3 (three) times daily.   168 each   0   . isosorbide mononitrate (IMDUR) 30 MG 24 hr tablet   Oral   Take 30 mg by mouth daily.           . metoprolol tartrate (LOPRESSOR) 25 MG tablet   Oral   Take 25 mg by mouth daily.           . nitroGLYCERIN (NITROSTAT) 0.6 MG SL tablet   Sublingual   Place 0.6 mg under the tongue every 5 (five)  minutes as needed. For chest pain         . pravastatin (PRAVACHOL) 40 MG tablet   Oral   Take 40 mg by mouth daily.            BP 112/53  Pulse 70  Temp(Src) 97.5 F (36.4 C) (Oral)  Resp 18  SpO2 99% Physical Exam  Nursing note and vitals reviewed. Constitutional: She is oriented to person, place, and time. Vital signs are normal. She appears well-developed and well-nourished. No distress.  HENT:  Head: Normocephalic and atraumatic.  Cardiovascular: Normal rate, normal heart sounds and normal pulses.  A regularly irregular rhythm present.  No murmur heard. Pulmonary/Chest: Effort normal. No respiratory distress. She exhibits tenderness. Right breast exhibits no inverted nipple, no mass, no nipple discharge and no skin change. Left breast exhibits no inverted nipple and no skin change. Breasts are symmetrical.    Lymphadenopathy:    She has no axillary adenopathy.  Neurological: She is alert and oriented to person, place, and time. She has normal strength. Coordination normal.  Skin: Skin is warm and dry. No rash noted. She is not diaphoretic.  Psychiatric: She has a normal mood and affect. Judgment normal.    ED Course  Procedures (including critical care time) Labs Review Labs Reviewed - No data to display Imaging Review Dg Ribs Unilateral W/chest Left  09/15/2013   CLINICAL DATA:  Pain.  No trauma.  EXAM: LEFT RIBS AND CHEST - 3+ VIEW  COMPARISON:  Chest x-rays of 11/20/2011 and 10/20/2011.  FINDINGS: Mediastinum and hilar structures are normal. Heart size normal. The lungs are clear of acute infiltrates. Nodularity projected over right lung base is stable and represents overlapping shadows. No displaced rib fracture or focal bony abnormality. Degenerative changes both shoulders. No pneumothorax.  IMPRESSION: Negative.   Electronically Signed   By: Maisie Fus  Register   On: 09/15/2013 11:19    EKG shows multifocal PVCs.  MDM   1. Atypical chest pain    Chest pain  with history of MI. EKG changes. Transferred to ED via shuttle for workup of atypical chest pain  This patient's story is not classic for ACS, however no other obvious cause of the chest pain was found. Her chest pain is reproducible, nonradiating, pleuritic. Her EKG shows multifocal PVCs were as previously it appears that PVCs were unifocal. Given her history of an MI, along with her risk factor of not taking any of her preventative medications, we cannot rule out atypical presentation of ACS. She is being transferred to the emergency apartment for further evaluation.    Graylon Good, PA-C 09/15/13 1130

## 2013-09-15 NOTE — ED Notes (Signed)
MD at bedside - Dr. Goldston 

## 2013-09-15 NOTE — Discharge Planning (Signed)
[facility] Sue Armstrong- [employer]  Follow up appointment was made for 10/03/2013 at 3:15pm with the [facility] to establish primary care and to obtain the orange card. Application and my contact information was given for any future questions or concerns.

## 2013-09-15 NOTE — ED Notes (Signed)
Pt brought back to room, undressed, in gown, on monitor, continuous pulse oximetry and blood pressure cuff; warm blanket given by Debarah Crape, EMT

## 2013-09-15 NOTE — ED Provider Notes (Signed)
Medical screening examination/treatment/procedure(s) were performed by non-physician practitioner and as supervising physician I was immediately available for consultation/collaboration.  Leslee Home, M.D.  Reuben Likes, MD 09/15/13 [phone]

## 2013-09-15 NOTE — H&P (Signed)
[facility] History and Physical  MAITRI SCHNOEBELEN ZOX:[id number] DOB: 10-05-1961 DOA: 09/15/2013  Referring physician: Please review chart PCP: No primary provider on file.  Specialists: None  Chief Complaint: left chest wall discomfort  HPI: Sue Armstrong is a 52 y.o. female  With h/o obesity and prior MI that presented to the ED complaining of left chest discomfort.  Pain is not similar to last time she had MI in the past.  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  She woke up with pain at her left chest, which was worse with direct pressure to affected area. Denies any radiation of pain.  By the time I evaluated patient on floor she mentioned that she felt much better.  Denies any cough, hemoptysis, or diaphoresis with the left chest discomfort.  Review of Systems: 10 point review of system reviewed and negative unless otherwise mentioned above.  Past Medical History  Diagnosis Date  . MI (myocardial infarction) 2012  . CVA (cerebral vascular accident) Dec 2010    no residual  . Dizziness   . GERD (gastroesophageal reflux disease)    Past Surgical History  Procedure Laterality Date  . Breast lumpectomy  52 years old   Social History:  reports that she has never smoked. She does not have any smokeless tobacco history on file. She reports that she does not drink alcohol or use illicit drugs.  where does patient live--home, ALF, SNF? and with whom if at home?Lives at home  Can patient participate in ADLs? yes  Allergies  Allergen Reactions  . Codeine Itching    No family history on file. None reported  Prior to Admission medications   Medication Sig Start Date End Date Taking? Authorizing Provider  acetaminophen (TYLENOL) 500 MG tablet Take 1,000 mg by mouth every 6 (six) hours as needed.   Yes Historical Provider, MD   Physical Exam: Filed Vitals:   09/15/13 1537  BP: 116/66  Pulse: 64  Temp: 98.4 F (36.9 C)  Resp: 18     General:  Pt in  NAD, alert and Awake  Eyes: EOMI, non icteric  ENT: normal exterior appearance, MMM  Neck: no goiter, supple   Cardiovascular: RRR, no MRG  Respiratory: CTA BL, no wheezes  Abdomen: soft, NT, ND  Skin: No rash over left breast, cellulitis, or induration  Musculoskeletal: pain over left anterior chest below left breast which is reproducible on palpation  Psychiatric: mood and affect appropriate  Neurologic: answers questions appropriately, moves extremities equally.  Labs on Admission:  Basic Metabolic Panel:  Recent Labs Lab 09/15/13 1241  NA 139  K 3.9  CL 104  CO2 25  GLUCOSE 79  BUN 13  CREATININE 0.82  CALCIUM 9.1   Liver Function Tests: No results found for this basename: AST, ALT, ALKPHOS, BILITOT, PROT, ALBUMIN,  in the last 168 hours No results found for this basename: LIPASE, AMYLASE,  in the last 168 hours No results found for this basename: AMMONIA,  in the last 168 hours CBC:  Recent Labs Lab 09/15/13 1241  WBC 7.4  NEUTROABS 3.7  HGB 13.4  HCT 40.3  MCV 91.2  PLT 304   Cardiac Enzymes: No results found for this basename: CKTOTAL, CKMB, CKMBINDEX, TROPONINI,  in the last 168 hours  BNP (last 3 results) No results found for this basename: PROBNP,  in the last 8760 hours CBG: No results found for this basename: GLUCAP,  in the last 168 hours  Radiological Exams on Admission: Dg Chest 2 View  09/15/2013   CLINICAL DATA:  Chest pain  EXAM: CHEST  2 VIEW  COMPARISON:  Film from earlier in the same day.  FINDINGS: The heart size and mediastinal contours are within normal limits. Both lungs are clear. The visualized skeletal structures are unremarkable.  IMPRESSION: No active cardiopulmonary disease.   Electronically Signed   By: Alcide Clever M.D.   On: 09/15/2013 13:33   Dg Ribs Unilateral W/chest Left  09/15/2013   CLINICAL DATA:  Pain.  No trauma.  EXAM: LEFT RIBS AND CHEST - 3+ VIEW  COMPARISON:  Chest x-rays of 11/20/2011 and 10/20/2011.   FINDINGS: Mediastinum and hilar structures are normal. Heart size normal. The lungs are clear of acute infiltrates. Nodularity projected over right lung base is stable and represents overlapping shadows. No displaced rib fracture or focal bony abnormality. Degenerative changes both shoulders. No pneumothorax.  IMPRESSION: Negative.   Electronically Signed   By: Maisie Fus  Register   On: 09/15/2013 11:19    EKG: Independently reviewed. Sinus with no ST elevation or depressions. PVC   Assessment/Plan Active Problems:   1. Chest pain - Atypical and reproducible on palpation and lifting of left breast.  As such most likely musculoskeletal. - Will obtain troponins. Index of suspicion for cardiac cause low - lipid panel next am - telemetry monitoring.  2. Obesity - recommend weight loss   Code Status: full Family Communication: discussed with patient and family member Disposition Plan: If work up for cardiac cause negative (which suspect it will be) will plan on d/c next am.  Time spent: > 45 minutes  Penny Pia [facility] Pager [phone]  If 7PM-7AM, please contact night-coverage [email or web address] Password [facility] 09/15/2013, 5:28 PM

## 2013-09-15 NOTE — ED Notes (Signed)
States she having chest pain which started Wednesday night and progressively getting worst.   Patient states she has a cough which started Tuesday night and which sx only happen at night.   No medication taken Denies any congestion  States cough is wet but not productive States chest pain is bilateral across breast and hits center of chest

## 2013-09-15 NOTE — ED Provider Notes (Signed)
CSN: [id number]     Arrival date & time 09/15/13  1144 History   First MD Initiated Contact with Patient 09/15/13 1159     Chief Complaint  Patient presents with  . Chest Pain   (Consider location/radiation/quality/duration/timing/severity/associated sxs/prior Treatment) HPI Comments: 52 year old female presents with chest pain since yesterday. States is pretty constant when she woke up at 6 AM has been a sharp pain under her left breast. The pain seems to worsen some with walking, like when she is walking home from the bus stop. She's not seem to be particular short of breath. She states she had "deep cough" the day before this started but is no longer had the symptoms. Denies any fevers or other URI symptoms. The pain has been coming and going since this morning but his been constant for the past 4 hours. She urgent care was sent here for further evaluation. She had a heart attack 2 years ago but has not followed up with her cardiologist due to monetary concerns. She states this pain feels different than that heart attack.   Past Medical History  Diagnosis Date  . MI (myocardial infarction) 2012  . CVA (cerebral vascular accident) Dec 2010    no residual  . Dizziness   . GERD (gastroesophageal reflux disease)    Past Surgical History  Procedure Laterality Date  . Breast lumpectomy  52 years old   No family history on file. History  Substance Use Topics  . Smoking status: Never Smoker   . Smokeless tobacco: Not on file  . Alcohol Use: No   OB History   Grav Para Term Preterm Abortions TAB SAB Ect Mult Living                 Review of Systems  Constitutional: Negative for fever and chills.  Respiratory: Positive for cough. Negative for shortness of breath.   Cardiovascular: Positive for chest pain. Negative for leg swelling.  Gastrointestinal: Negative for vomiting and abdominal pain.  Musculoskeletal: Negative for back pain and neck pain.  All other systems reviewed and are  negative.    Allergies  Codeine  Home Medications   Current Outpatient Rx  Name  Route  Sig  Dispense  Refill  . amLODipine (NORVASC) 5 MG tablet   Oral   Take 5 mg by mouth daily.           Marland Kitchen aspirin (ASPIR-LOW) 81 MG EC tablet   Oral   Take 81 mg by mouth at bedtime.          Marland Kitchen Dextromethorphan-Guaifenesin (CORICIDIN HBP CONGESTION/COUGH) 10-200 MG CAPS   Oral   Take 1 tablet by mouth 3 (three) times daily.   168 each   0   . isosorbide mononitrate (IMDUR) 30 MG 24 hr tablet   Oral   Take 30 mg by mouth daily.           . metoprolol tartrate (LOPRESSOR) 25 MG tablet   Oral   Take 25 mg by mouth daily.           . nitroGLYCERIN (NITROSTAT) 0.6 MG SL tablet   Sublingual   Place 0.6 mg under the tongue every 5 (five) minutes as needed. For chest pain         . pravastatin (PRAVACHOL) 40 MG tablet   Oral   Take 40 mg by mouth daily.            BP 131/88  Pulse 62  Temp(Src) 97.8 F (  36.6 C) (Oral)  Resp 19  SpO2 99% Physical Exam  Nursing note and vitals reviewed. Constitutional: She is oriented to person, place, and time. She appears well-developed and well-nourished. No distress.  HENT:  Head: Normocephalic and atraumatic.  Right Ear: External ear normal.  Left Ear: External ear normal.  Nose: Nose normal.  Eyes: Right eye exhibits no discharge. Left eye exhibits no discharge.  Cardiovascular: Normal rate, regular rhythm and normal heart sounds.   Pulmonary/Chest: Effort normal and breath sounds normal. She has no wheezes. She has no rales. She exhibits tenderness (Under left breast).  Abdominal: Soft. There is no tenderness.  Musculoskeletal:       Thoracic back: She exhibits no tenderness.  Neurological: She is alert and oriented to person, place, and time.  Skin: Skin is warm and dry. No rash noted.    ED Course  Procedures (including critical care time) Labs Review Labs Reviewed  BASIC METABOLIC PANEL - Abnormal; Notable for the  following:    GFR calc non Af Amer 80 (*)    All other components within normal limits  CBC WITH DIFFERENTIAL  D-DIMER, QUANTITATIVE  POCT I-STAT TROPONIN I   Imaging Review Dg Ribs Unilateral W/chest Left  09/15/2013   CLINICAL DATA:  Pain.  No trauma.  EXAM: LEFT RIBS AND CHEST - 3+ VIEW  COMPARISON:  Chest x-rays of 11/20/2011 and 10/20/2011.  FINDINGS: Mediastinum and hilar structures are normal. Heart size normal. The lungs are clear of acute infiltrates. Nodularity projected over right lung base is stable and represents overlapping shadows. No displaced rib fracture or focal bony abnormality. Degenerative changes both shoulders. No pneumothorax.  IMPRESSION: Negative.   Electronically Signed   By: Maisie Fus  Register   On: 09/15/2013 11:19    EKG Interpretation     Ventricular Rate:  58 PR Interval:  185 QRS Duration: 79 QT Interval:  414 QTC Calculation: 407 R Axis:   49 Text Interpretation:  Sinus rhythm Ventricular trigeminy Nonspecific T wave abnormality            MDM   1. Atypical chest pain    Patient's pain improved while in ED. Doubt PE with low risk and neg Ddimer. Unlikely to be dissection based on history and benign Xray. Her CP is atypical, but is concerning with an exertional component and an episode of vomiting. Given her prior history (possible vasospasm?), will admit to medicine for rule out and possibly more testing.    Audree Camel, MD 09/15/13 [phone]

## 2013-09-16 DIAGNOSIS — E785 Hyperlipidemia, unspecified: Secondary | ICD-10-CM

## 2013-09-16 LAB — LIPID PANEL
Cholesterol: 207 mg/dL — ABNORMAL HIGH (ref 0–200)
LDL Cholesterol: 134 mg/dL — ABNORMAL HIGH (ref 0–99)
Total CHOL/HDL Ratio: 4 RATIO
VLDL: 21 mg/dL (ref 0–40)

## 2013-09-16 LAB — TROPONIN I: Troponin I: <0.3 ng/mL (ref <0.30)

## 2013-09-16 MED ORDER — ASPIRIN EC 81 MG PO TBEC: 81.0000 mg | DELAYED_RELEASE_TABLET | Freq: Every day | ORAL | Status: DC

## 2013-09-16 MED ORDER — LOVASTATIN 20 MG PO TABS: 20.0000 mg | ORAL_TABLET | Freq: Every day | ORAL | Status: DC

## 2013-09-16 NOTE — Discharge Summary (Signed)
Physician Discharge Summary  Sue Armstrong:[id number] DOB: 07/21/1961 DOA: 09/15/2013  PCP: No PCP Per Patient  Admit date: 09/15/2013 Discharge date: 09/16/2013  Time spent: Less than 30 minutes  Recommendations for Outpatient Follow-up:  1. [facility] on 10/03/2013 at 3:30 PM. 2. Recommend out patient 2 D Echo to evaluate LV function.  Discharge Diagnoses:  Active Problems:   Obesity, unspecified   Discharge Condition: Improved & Stable  Diet recommendation: Heart Healthy  Filed Weights   09/15/13 1537  Weight: 95.3 kg (210 lb 1.6 oz)    History of present illness & hospital course:  52 year old female patient with prior history of non-ST elevation MI, prior cardiac catheterization in 2005 with a positive troponin, left MCA CVA 2011 followed by another CVA in December 2010, dyslipidemia, repeat cath during the episode of non-ST elevation MI in July 2012 which revealed patent coronaries and there was a suggestion of a small distal branch occlusion of the circumflex. She was discharged on amlodipine, carvedilol, nitrates, aspirin and statin. Patient however states that she has not taken any medication since March 2014 secondary to affordability issues. She works at a daycare center. She states that she lifted a child who was heavy/55 pounds and immediately experienced a sharp pain underneath her left breast which subsided. However the next day the pain got worse. It was aggravated by upper extremity movement such as bathing, bending down, moving her chest walls and deep breaths. She denies dyspnea, palpitations, nausea or sweating. She clearly states that her pain was distinctly different than her prior MI related pains. She presented to the ED where EKG and initial troponins were negative. Her chest pain was reproducible to palpation. She was admitted to the telemetry which showed sinus rhythm-sinus bradycardia in the 50s. There is also a concerning  rhythm strip which was reviewed with [facility] cardiology who stated that this was occasional PVCs and retrograde P waves related to the PVCs (functional block) and they did not recommend any further evaluation for this in an asymptomatic patient. Patient's chest pain resolved since sometime yesterday afternoon. Cardiac enzymes cycled and negative. D-dimer negative. Chest x-ray without acute changes. Repeat EKG without acute changes. Patient has been counseled regarding compliance with medications and M.D. followups and she verbalizes understanding. She will be resumed on low dose aspirin and lovastatin which is available through [facility] at [facility]. Due to soft blood pressures, will not start back any antihypertensives or carvedilol. She also had LVEF of 45% by cath with global hypokinesis in 2012. Recommend repeating outpatient echocardiogram to see if her LV function has improved. May consider further outpatient cardiology consultation if the EF remains low. So her current admission for chest pain was most likely of musculoskeletal etiology and has resolved.    Consultations:  None  Procedures:  None    Discharge Exam:  Complaints:  Chest pain has resolved. Patient denies any other complaints.  Filed Vitals:   09/15/13 1537 09/15/13 2100 09/16/13 0022 09/16/13 0400  BP: 116/66 116/54 115/60 139/49  Pulse: 64 61 63 55  Temp: 98.4 F (36.9 C) 97.6 F (36.4 C) 97.7 F (36.5 C) 97.8 F (36.6 C)  TempSrc: Oral Oral Oral Oral  Resp: 18 18 18 18   Height: 5\' 4"  (1.626 m)     Weight: 95.3 kg (210 lb 1.6 oz)     SpO2: 99% 98% 99% 100%    General exam: Moderately built and overweight female lying comfortably supine in  bed. Respiratory system: Clear. No increased work of breathing. Minimal left lower anterior rib cage tenderness to deep palpation. Cardiovascular system: S1 & S2 heard, RRR. No JVD, murmurs, gallops, clicks or pedal edema. Telemetry: Sinus bradycardia in the  50s-sinus rhythm. Gastrointestinal system: Abdomen is nondistended, soft and nontender. Normal bowel sounds heard. Central nervous system: Alert and oriented. No focal neurological deficits. Extremities: Symmetric 5 x 5 power.  Discharge Instructions      Discharge Orders   Future Appointments Provider Department Dept Phone   10/03/2013 3:30 PM Doris Cheadle, MD [facility] [phone]   10/03/2013 4:00 PM [employer] Financial Counselor [facility] [phone]   Future Orders Complete By Expires   Activity as tolerated - No restrictions  As directed    Call MD for:  severe uncontrolled pain  As directed    Diet - low sodium heart healthy  As directed        Medication List         acetaminophen 500 MG tablet  Commonly known as:  TYLENOL  Take 1,000 mg by mouth every 6 (six) hours as needed.     aspirin EC 81 MG tablet  Take 1 tablet (81 mg total) by mouth daily.     lovastatin 20 MG tablet  Commonly known as:  MEVACOR  Take 1 tablet (20 mg total) by mouth at bedtime.       Follow-up Information   Follow up with [facility] On 10/03/2013. (@ 3:30  for hospital f/u and at 4:00 pm plan for orange card assistance for medication)    Contact information:   [address] [place] [address] [phone]       The results of significant diagnostics from this hospitalization (including imaging, microbiology, ancillary and laboratory) are listed below for reference.    Significant Diagnostic Studies: Dg Chest 2 View  09/15/2013   CLINICAL DATA:  Chest pain  EXAM: CHEST  2 VIEW  COMPARISON:  Film from earlier in the same day.  FINDINGS: The heart size and mediastinal contours are within normal limits. Both lungs are clear. The visualized skeletal structures are unremarkable.  IMPRESSION: No active cardiopulmonary disease.   Electronically Signed   By: Alcide Clever M.D.   On:  09/15/2013 13:33   Dg Ribs Unilateral W/chest Left  09/15/2013   CLINICAL DATA:  Pain.  No trauma.  EXAM: LEFT RIBS AND CHEST - 3+ VIEW  COMPARISON:  Chest x-rays of 11/20/2011 and 10/20/2011.  FINDINGS: Mediastinum and hilar structures are normal. Heart size normal. The lungs are clear of acute infiltrates. Nodularity projected over right lung base is stable and represents overlapping shadows. No displaced rib fracture or focal bony abnormality. Degenerative changes both shoulders. No pneumothorax.  IMPRESSION: Negative.   Electronically Signed   By: Maisie Fus  Register   On: 09/15/2013 11:19    Microbiology: No results found for this or any previous visit (from the past 240 hour(s)).   Labs: Basic Metabolic Panel:  Recent Labs Lab 09/15/13 1241 09/15/13 1851  NA 139  --   K 3.9  --   CL 104  --   CO2 25  --   GLUCOSE 79  --   BUN 13  --   CREATININE 0.82 0.76  CALCIUM 9.1  --    Liver Function Tests: No results found for this basename: AST, ALT, ALKPHOS, BILITOT, PROT, ALBUMIN,  in the last 168  hours No results found for this basename: LIPASE, AMYLASE,  in the last 168 hours No results found for this basename: AMMONIA,  in the last 168 hours CBC:  Recent Labs Lab 09/15/13 1241 09/15/13 1851  WBC 7.4 7.8  NEUTROABS 3.7  --   HGB 13.4 12.3  HCT 40.3 37.4  MCV 91.2 91.0  PLT 304 285   Cardiac Enzymes:  Recent Labs Lab 09/15/13 1850 09/15/13 2322 09/16/13 0519  TROPONINI <0.30 <0.30 <0.30   BNP: BNP (last 3 results) No results found for this basename: PROBNP,  in the last 8760 hours CBG: No results found for this basename: GLUCAP,  in the last 168 hours  Additional labs: 1. Fasting lipids: Cholesterol 207, triglycerides 105, HDL 52, LDL 134 and VLDL 21 2. D dimer: < 0.27 3. EKG 09/16/13: Sinus bradycardia at 58 beats per minute, normal axis, and no acute changes. QTC 408 ms.   Signed:  Marcellus Scott, MD, FACP, FHM. [facility] Pager  [phone]  If 7PM-7AM, please contact night-coverage [email or web address] Password [email or web address] 09/16/2013, 12:19 PM

## 2013-09-16 NOTE — Care Management (Signed)
Hospital F/u appointments for PCP in the system and pt is aware. No further needs from CM at this time. Gala Lewandowsky, RN,BSN [phone]

## 2013-09-28 ENCOUNTER — Ambulatory Visit: Payer: Self-pay | Admitting: Internal Medicine

## 2013-10-03 ENCOUNTER — Ambulatory Visit: Payer: Self-pay | Attending: Internal Medicine | Admitting: Internal Medicine

## 2013-10-03 ENCOUNTER — Encounter: Payer: Self-pay | Admitting: Internal Medicine

## 2013-10-03 ENCOUNTER — Ambulatory Visit: Payer: Self-pay | Admitting: Internal Medicine

## 2013-10-03 ENCOUNTER — Ambulatory Visit: Payer: Self-pay

## 2013-10-03 VITALS — BP 100/69 | HR 100 | Temp 98.0°F | Resp 17 | Wt 210.4 lb

## 2013-10-03 DIAGNOSIS — E669 Obesity, unspecified: Secondary | ICD-10-CM | POA: Insufficient documentation

## 2013-10-03 DIAGNOSIS — Z139 Encounter for screening, unspecified: Secondary | ICD-10-CM

## 2013-10-03 DIAGNOSIS — R0789 Other chest pain: Secondary | ICD-10-CM | POA: Insufficient documentation

## 2013-10-03 DIAGNOSIS — I252 Old myocardial infarction: Secondary | ICD-10-CM | POA: Insufficient documentation

## 2013-10-03 DIAGNOSIS — E785 Hyperlipidemia, unspecified: Secondary | ICD-10-CM | POA: Insufficient documentation

## 2013-10-03 NOTE — Progress Notes (Signed)
Patient here for hospital follow up Went to the ED for pains under her left breast  Was told she had a muscle strain

## 2013-10-03 NOTE — Progress Notes (Signed)
Patient ID: Sue Armstrong, female   DOB: 29-Jan-1961, 51 y.o.   MRN: [id number] MRN: [id number] Name: Sue Armstrong  Sex: female Age: 52 y.o. DOB: 20-Mar-1961  Allergies: Codeine  Chief Complaint  Patient presents with  . Hospitalization Follow-up    HPI: Patient is 52 y.o. female who comes for the first time to establish medical care, she is history of CVA, MI in the past dyslipidemia, recently she was hospitalized with symptoms of chest pain, EMR reviewed troponin x3 negative MI was ruled out, patient is currently taking aspirin and lovastatin which she started taking in the past but recently started, blood work reviewed LDL 134, currently she denies any acute symptoms has not seen cardiologist.  Past Medical History  Diagnosis Date  . Dizziness   . GERD (gastroesophageal reflux disease)   . High cholesterol   . MI (myocardial infarction) 2012  . Migraines     "had migraines in 1978" (09/15/2013)  . CVA (cerebral vascular accident) 10/2009; 2012    residual "my memory is not very good at times" (09/15/2013)  . Arthritis     "not dx'd but my knees ache" (09/15/2013)    Past Surgical History  Procedure Laterality Date  . Tonsillectomy  1973  . Breast lumpectomy Right 1978  . Cardiac catheterization  2005; 2012      Medication List       This list is accurate as of: 10/03/13  4:30 PM.  Always use your most recent med list.               acetaminophen 500 MG tablet  Commonly known as:  TYLENOL  Take 1,000 mg by mouth every 6 (six) hours as needed.     aspirin EC 81 MG tablet  Take 1 tablet (81 mg total) by mouth daily.     lovastatin 20 MG tablet  Commonly known as:  MEVACOR  Take 1 tablet (20 mg total) by mouth at bedtime.        No orders of the defined types were placed in this encounter.    Immunization History  Administered Date(s) Administered  . Influenza Split 11/20/2011  . Td 02/07/2006    History  Substance Use Topics  . Smoking status: Never  Smoker   . Smokeless tobacco: Never Used  . Alcohol Use: No    Review of Systems  As noted in HPI  Filed Vitals:   10/03/13 1550  BP: 100/69  Pulse: 100  Temp: 98 F (36.7 C)  Resp: 17    Physical Exam  Physical Exam  Constitutional:  Obese female sitting comfortably not in acute distress  Eyes: EOM are normal. Pupils are equal, round, and reactive to light.  Cardiovascular: Normal rate and regular rhythm.   Pulmonary/Chest: Breath sounds normal. No respiratory distress. She has no wheezes. She has no rales.  Musculoskeletal: She exhibits no edema.    CBC    Component Value Date/Time   WBC 7.8 09/15/2013 1851   RBC 4.11 09/15/2013 1851   HGB 12.3 09/15/2013 1851   HCT 37.4 09/15/2013 1851   PLT 285 09/15/2013 1851   MCV 91.0 09/15/2013 1851   LYMPHSABS 3.2 09/15/2013 1241   MONOABS 0.4 09/15/2013 1241   EOSABS 0.1 09/15/2013 1241   BASOSABS 0.0 09/15/2013 1241    CMP     Component Value Date/Time   NA 139 09/15/2013 1241   K 3.9 09/15/2013 1241   CL 104 09/15/2013 1241   CO2 25 09/15/2013  1241   GLUCOSE 79 09/15/2013 1241   BUN 13 09/15/2013 1241   CREATININE 0.76 09/15/2013 1851   CALCIUM 9.1 09/15/2013 1241   PROT 7.1 05/09/2011 0635   ALBUMIN 3.3* 05/09/2011 0635   AST 46* 05/09/2011 0635   ALT 16 05/09/2011 0635   ALKPHOS 113 05/09/2011 0635   BILITOT 0.3 05/09/2011 0635   GFRNONAA >90 09/15/2013 1851   GFRAA >90 09/15/2013 1851    Lab Results  Component Value Date/Time   CHOL 207* 09/16/2013  5:19 AM    No components found with this basename: hga1c    Lab Results  Component Value Date/Time   AST 46* 05/09/2011  6:35 AM    Assessment and Plan  Other chest pain - Plan: History of MI, given Ambulatory referral to Cardiology  DYSLIPIDEMIA... continue with lovastatin 20 mg daily  will recheck blood work prior to the next visit  Obesity, unspecified... advised her diet and exercise  History of MI (myocardial infarction) - Plan: Ambulatory  referral to Cardiology  Screening - Plan: TSH, Lipid panel, Vit D  25 hydroxy (rtn osteoporosis monitoring), CBC with Differential, COMPLETE METABOLIC PANEL WITH GFR, Ambulatory referral to Gynecology  Patient declined for flu shot.  Return in about 2 months (around 12/04/2013).  Doris Cheadle, MD

## 2013-10-12 ENCOUNTER — Ambulatory Visit: Payer: Self-pay | Admitting: Cardiology

## 2013-12-08 ENCOUNTER — Other Ambulatory Visit: Payer: Self-pay

## 2013-12-12 ENCOUNTER — Ambulatory Visit: Payer: Self-pay | Admitting: Internal Medicine

## 2015-03-09 IMAGING — CR DG CHEST 2V
2 series · 2 of 2 positions shown · non-contrast
Comparison: Film from earlier in the same day.

CLINICAL DATA: Chest pain

EXAM:
CHEST  2 VIEW

[w chest pa]
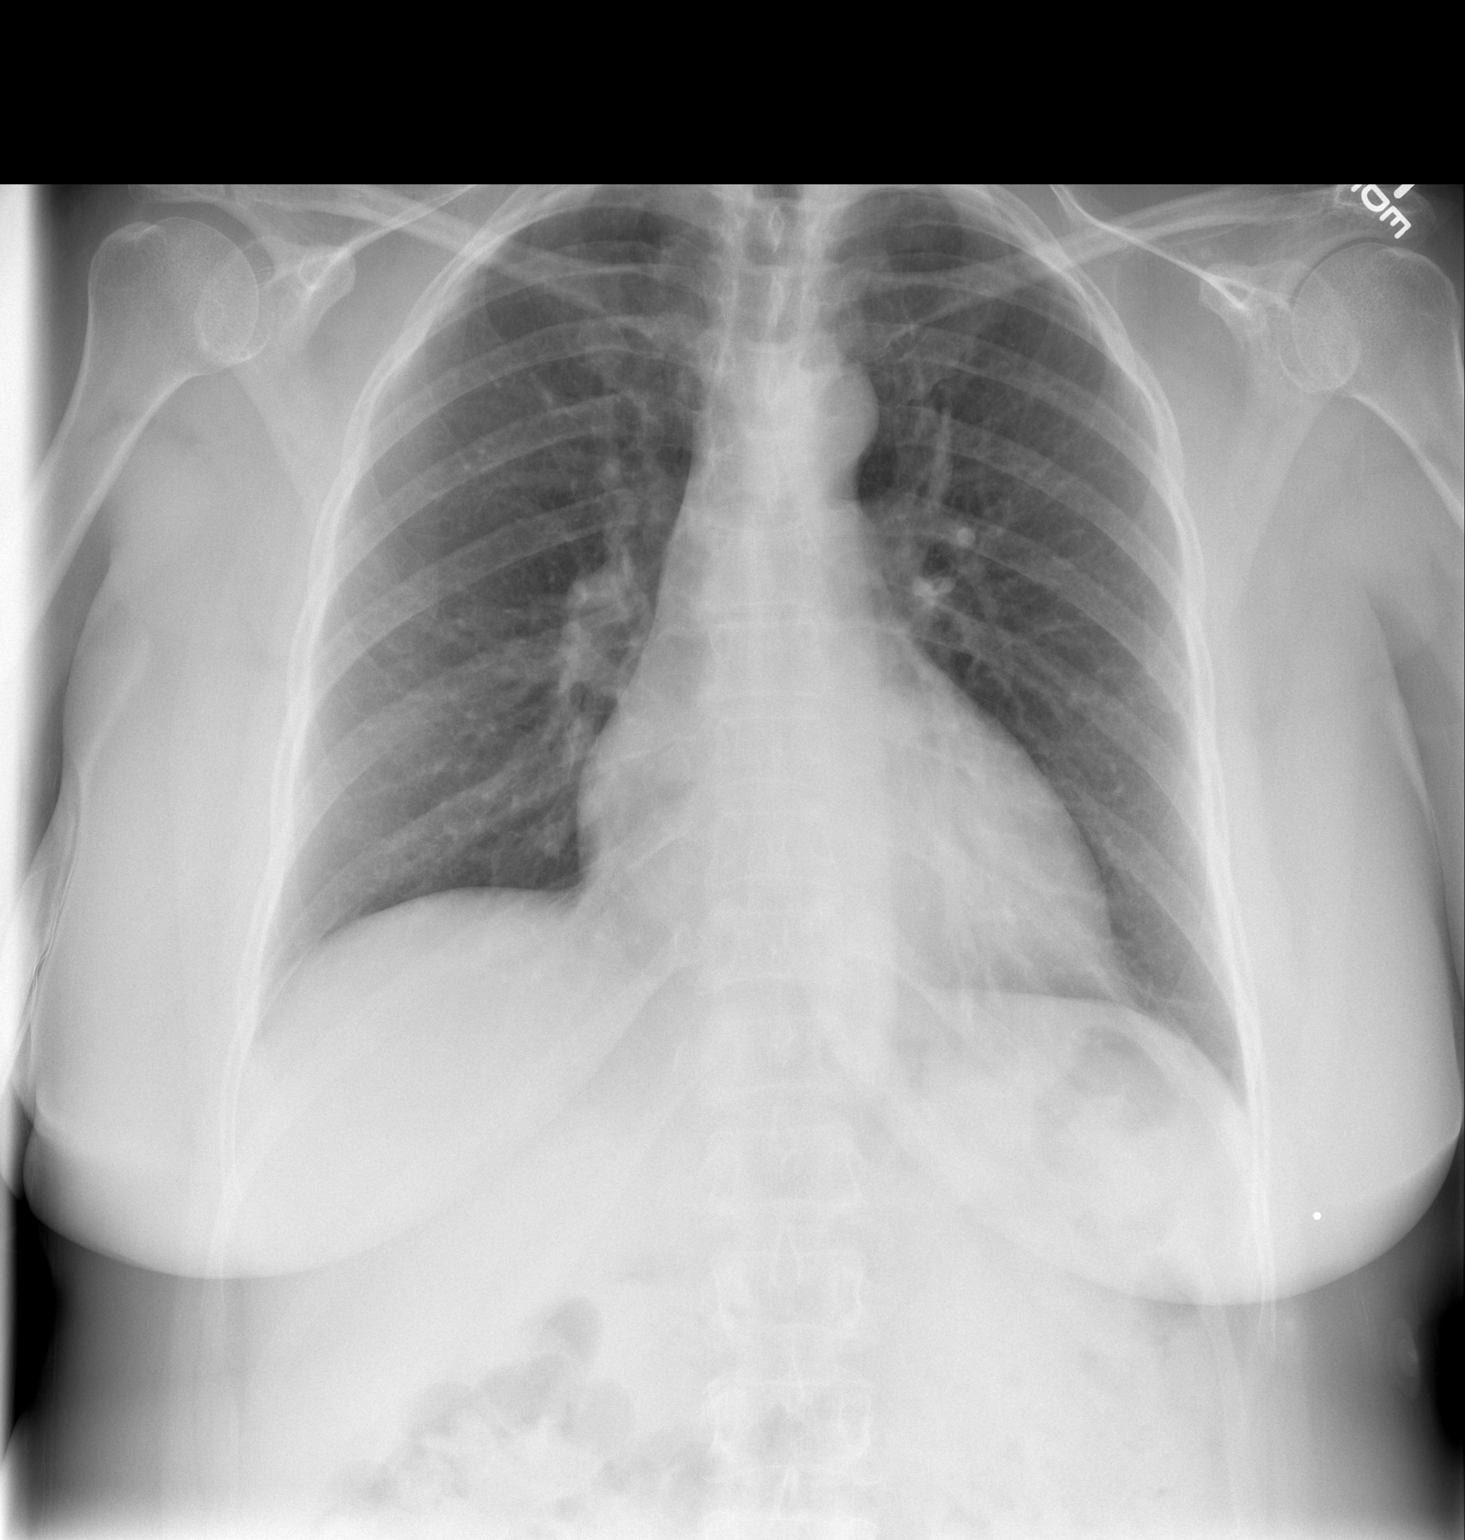

[w chest lat]
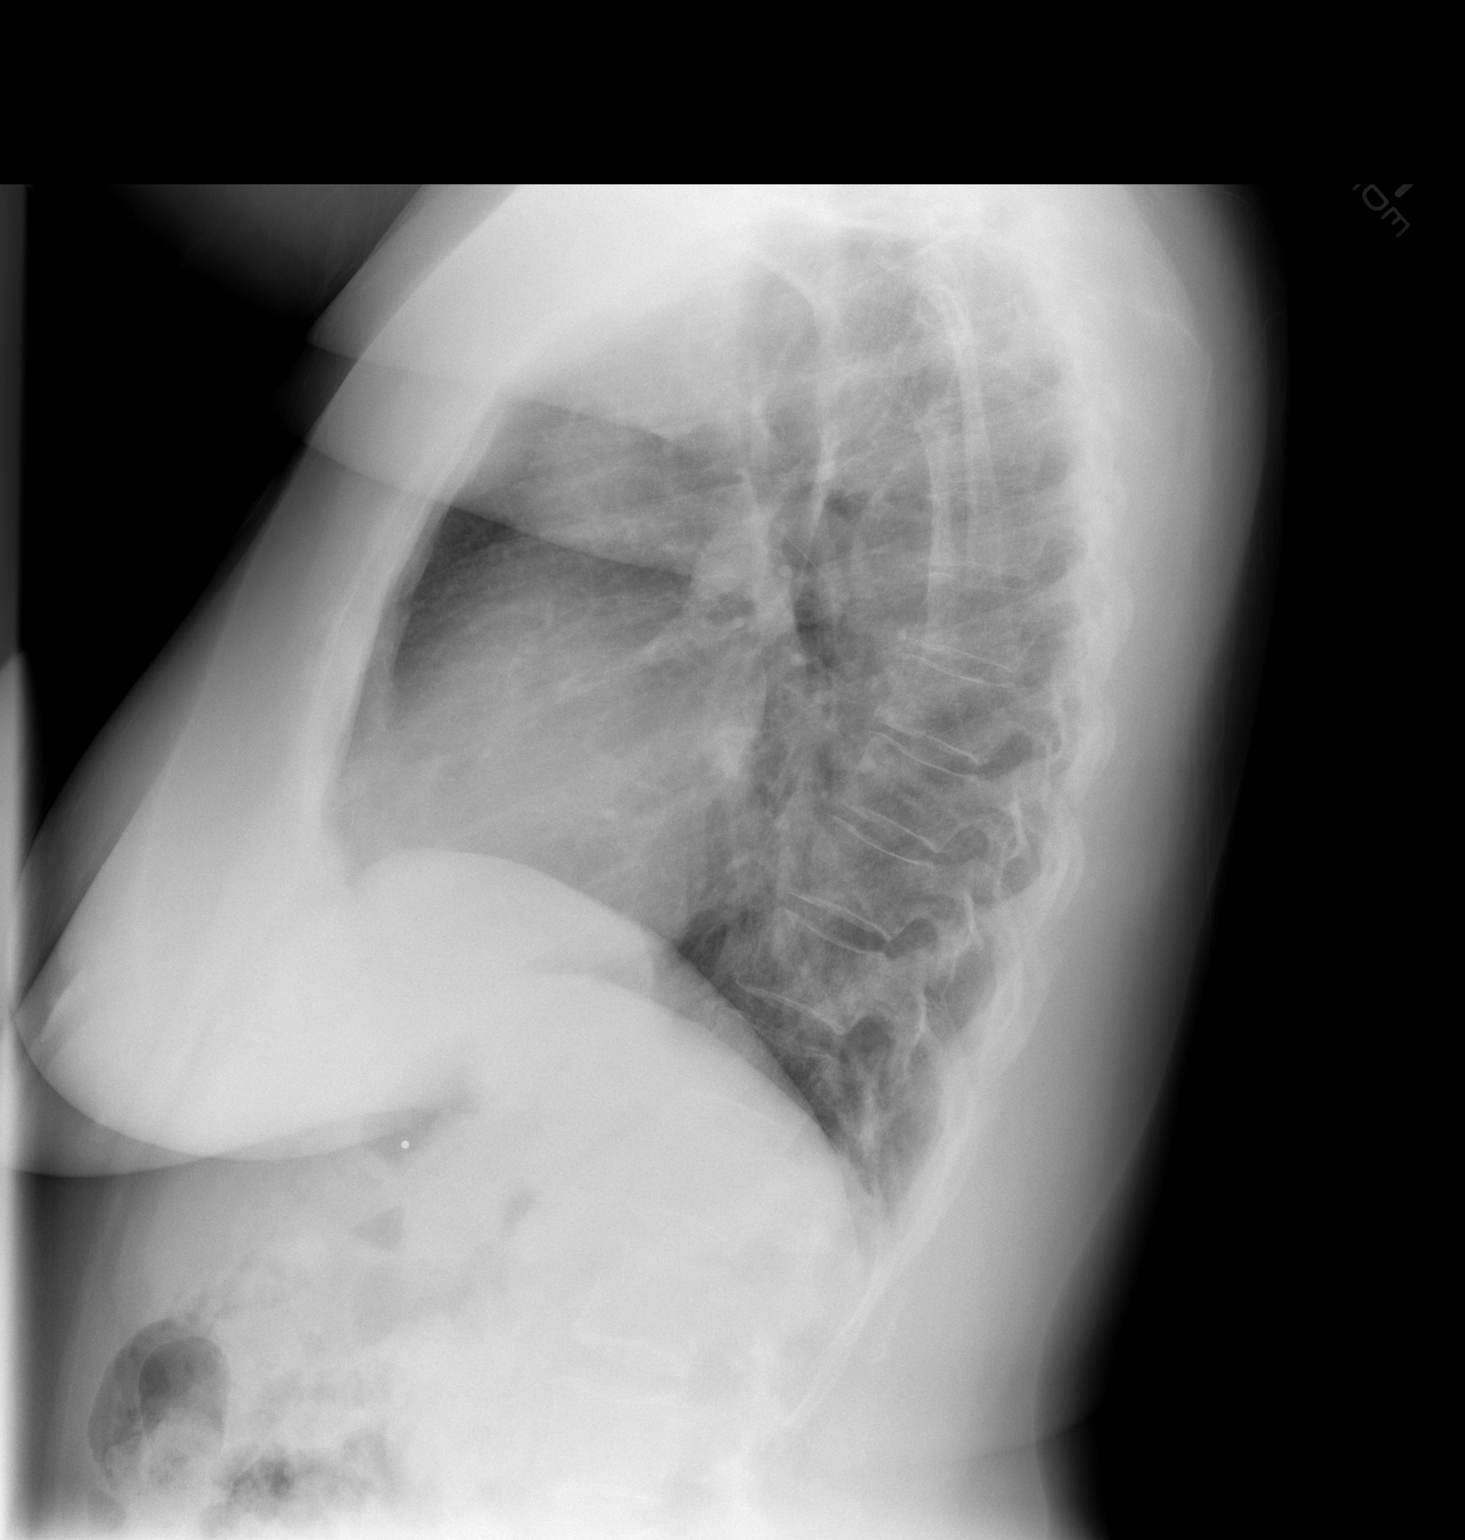

[2 of 2 positions shown; findings below may reference images not displayed]

FINDINGS: The heart size and mediastinal contours are within normal limits.
Both lungs are clear. The visualized skeletal structures are
unremarkable.
IMPRESSION: No active cardiopulmonary disease.

## 2015-03-09 IMAGING — CR DG RIBS W/ CHEST 3+V*L*
3 series · 3 of 3 positions shown · non-contrast
Comparison: Chest x-rays of 11/20/2011 and 10/20/2011.

CLINICAL DATA: Pain.  No trauma.

EXAM:
LEFT RIBS AND CHEST - 3+ VIEW

[view not recorded (1 of 3)]
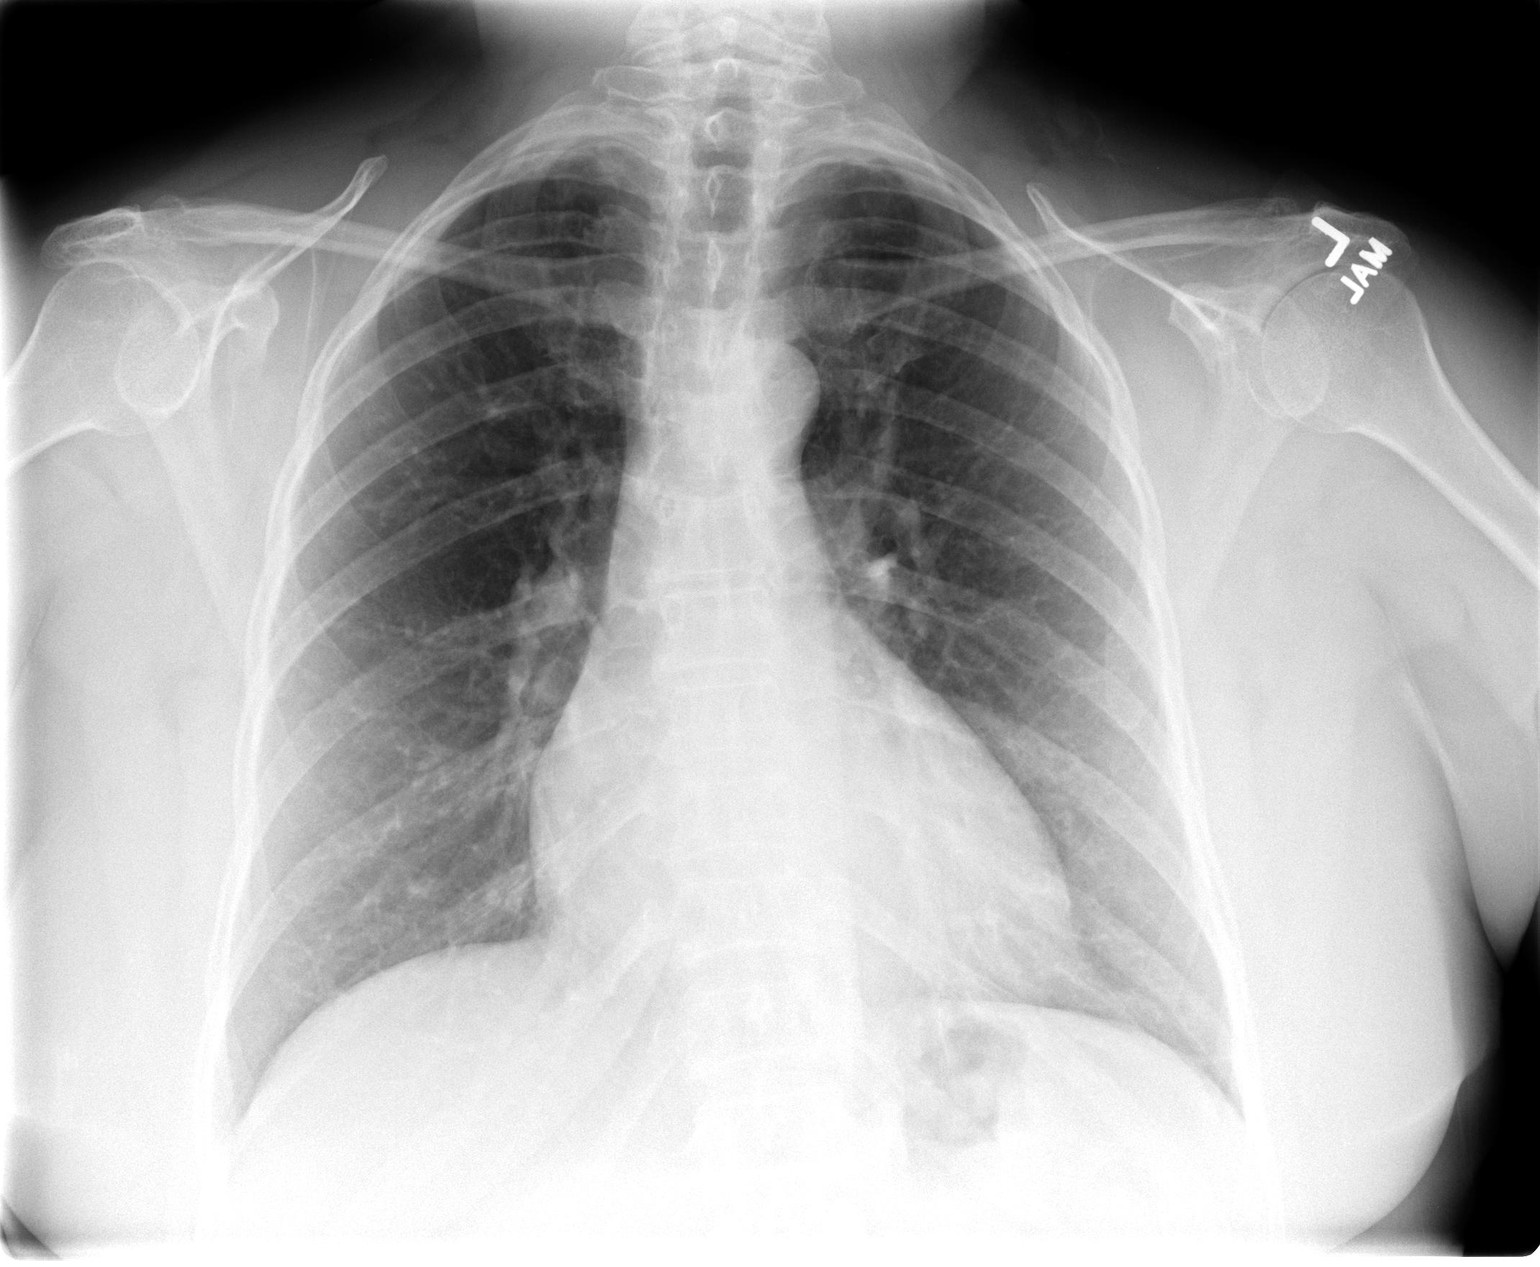

[view not recorded (2 of 3)]
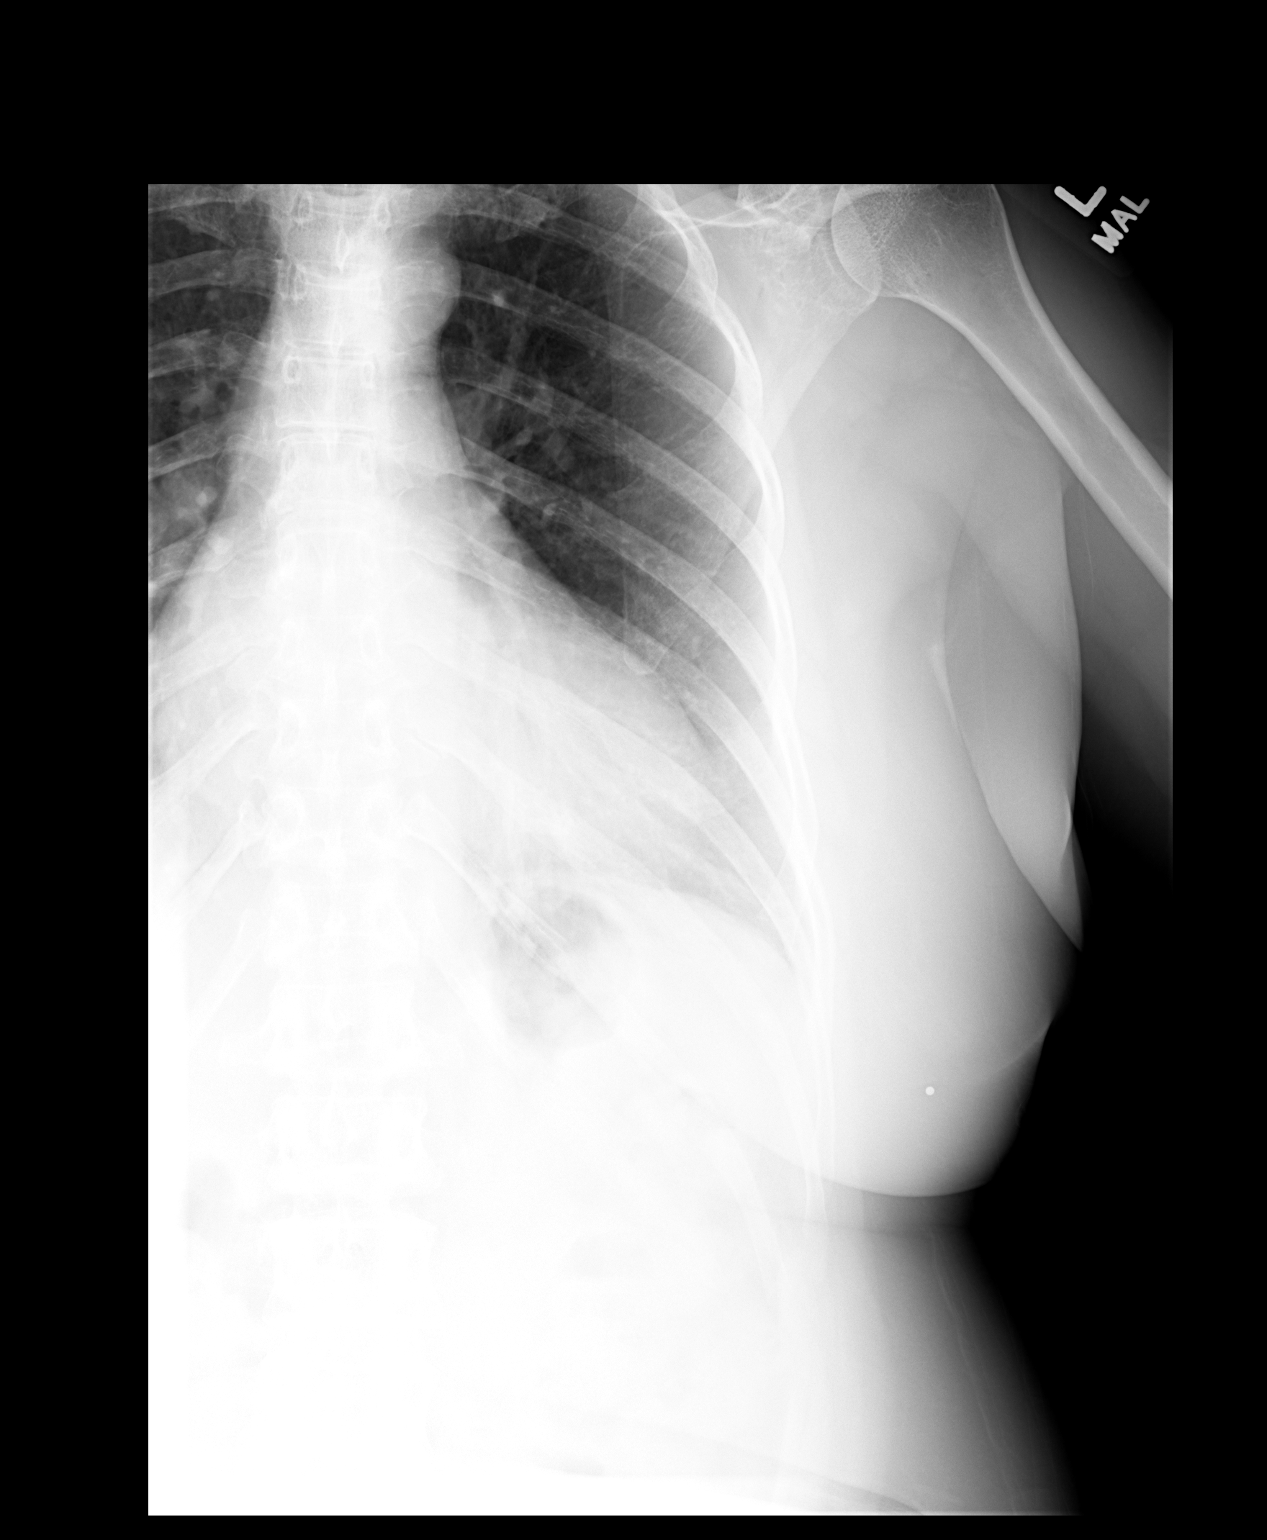

[view not recorded (3 of 3)]
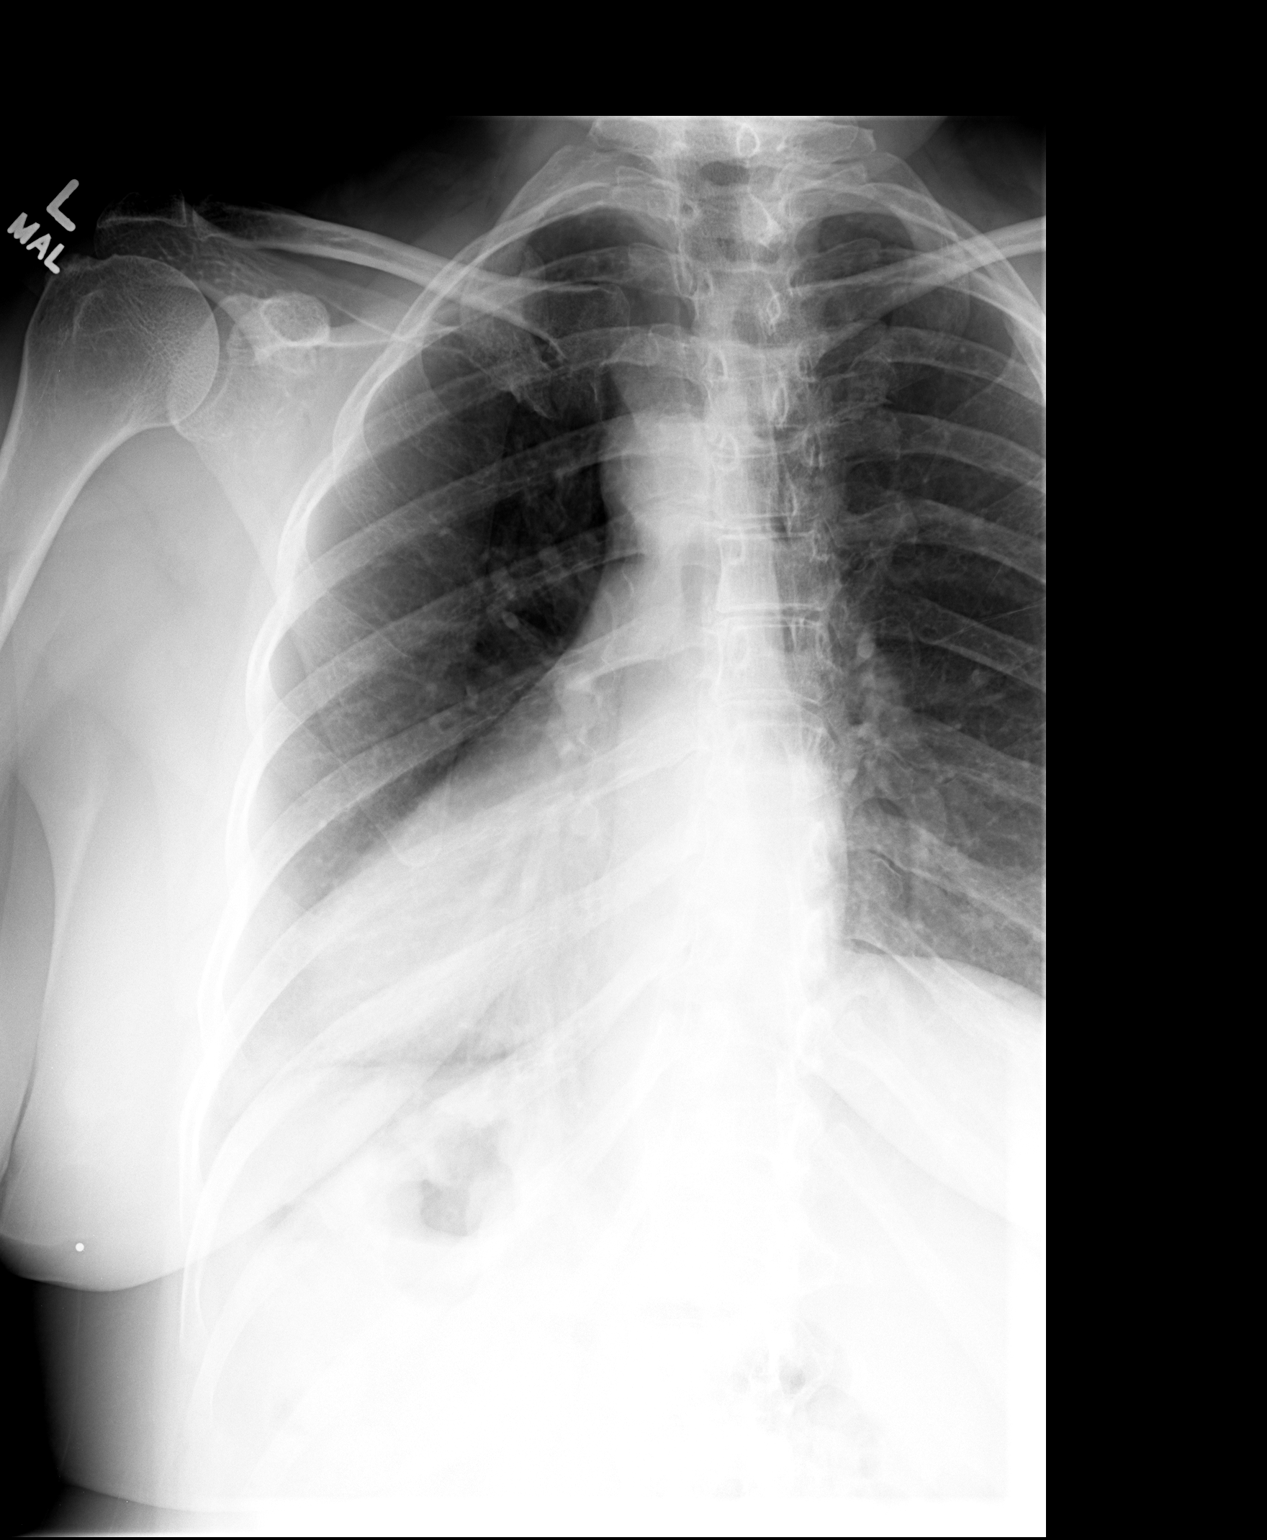

[3 of 3 positions shown; findings below may reference images not displayed]

FINDINGS: Mediastinum and hilar structures are normal. Heart size normal. The
lungs are clear of acute infiltrates. Nodularity projected over
right lung base is stable and represents overlapping shadows. No
displaced rib fracture or focal bony abnormality. Degenerative
changes both shoulders. No pneumothorax.
IMPRESSION: Negative.

## 2015-09-08 ENCOUNTER — Other Ambulatory Visit: Payer: Self-pay | Admitting: Family Medicine

## 2015-09-08 DIAGNOSIS — Z1231 Encounter for screening mammogram for malignant neoplasm of breast: Secondary | ICD-10-CM

## 2016-07-31 ENCOUNTER — Other Ambulatory Visit: Payer: Self-pay | Admitting: Cardiology

## 2016-07-31 ENCOUNTER — Emergency Department (HOSPITAL_COMMUNITY)
Admission: EM | Admit: 2016-07-31 | Discharge: 2016-07-31 | Disposition: A | Discharge status: Home / Self Care | Payer: Self-pay | Attending: Emergency Medicine | Admitting: Emergency Medicine

## 2016-07-31 ENCOUNTER — Emergency Department (HOSPITAL_COMMUNITY): Payer: Self-pay

## 2016-07-31 ENCOUNTER — Encounter (HOSPITAL_COMMUNITY): Payer: Self-pay | Admitting: Emergency Medicine

## 2016-07-31 DIAGNOSIS — R079 Chest pain, unspecified: Secondary | ICD-10-CM

## 2016-07-31 DIAGNOSIS — I2510 Atherosclerotic heart disease of native coronary artery without angina pectoris: Secondary | ICD-10-CM

## 2016-07-31 DIAGNOSIS — Z8673 Personal history of transient ischemic attack (TIA), and cerebral infarction without residual deficits: Secondary | ICD-10-CM | POA: Insufficient documentation

## 2016-07-31 DIAGNOSIS — R0789 Other chest pain: Secondary | ICD-10-CM | POA: Insufficient documentation

## 2016-07-31 DIAGNOSIS — I252 Old myocardial infarction: Secondary | ICD-10-CM | POA: Insufficient documentation

## 2016-07-31 DIAGNOSIS — Z7982 Long term (current) use of aspirin: Secondary | ICD-10-CM | POA: Insufficient documentation

## 2016-07-31 DIAGNOSIS — Z955 Presence of coronary angioplasty implant and graft: Secondary | ICD-10-CM | POA: Insufficient documentation

## 2016-07-31 LAB — BASIC METABOLIC PANEL
Anion gap: 8 (ref 5–15)
BUN: 10 mg/dL (ref 6–20)
CHLORIDE: 108 mmol/L (ref 101–111)
CO2: 24 mmol/L (ref 22–32)
Calcium: 9.3 mg/dL (ref 8.9–10.3)
Creatinine, Ser: 0.88 mg/dL (ref 0.44–1.00)
GFR calc non Af Amer: >60 mL/min (ref >60)
Glucose, Bld: 96 mg/dL (ref 65–99)
Potassium: 3.9 mmol/L (ref 3.5–5.1)
Sodium: 140 mmol/L (ref 135–145)

## 2016-07-31 LAB — CBC
HCT: 42.7 % (ref 36.0–46.0)
Hemoglobin: 13.7 g/dL (ref 12.0–15.0)
MCH: 29.1 pg (ref 26.0–34.0)
MCHC: 32.1 g/dL (ref 30.0–36.0)
MCV: 90.7 fL (ref 78.0–100.0)
PLATELETS: 328 10*3/uL (ref 150–400)
RBC: 4.71 MIL/uL (ref 3.87–5.11)
RDW: 14.6 % (ref 11.5–15.5)
WBC: 6.7 10*3/uL (ref 4.0–10.5)

## 2016-07-31 LAB — I-STAT TROPONIN, ED
Troponin i, poc: 0 ng/mL (ref 0.00–0.08)
Troponin i, poc: 0.01 ng/mL (ref 0.00–0.08)

## 2016-07-31 MED ORDER — KETOROLAC TROMETHAMINE 30 MG/ML IJ SOLN
30.0000 mg | Freq: Once | INTRAMUSCULAR | Status: AC
  Administered 2016-07-31: 30 mg via INTRAVENOUS
  Filled 2016-07-31: qty 1

## 2016-07-31 NOTE — ED Notes (Signed)
Patient transported to X-ray 

## 2016-07-31 NOTE — ED Triage Notes (Signed)
Pt arrives via POv from home with left sided chest pain that began Sunday. Pt states pain is nonradiating. Pt reports increased SOB and heaviness yesterday. Pt with hx of nstemi and stroke.

## 2016-07-31 NOTE — Consult Note (Signed)
Consult Note    Patient ID: Sue Armstrong MRN: [id number], DOB/AGE: 04/25/1961   Admit date: 07/31/2016   Primary Physician: Jeanann Lewandowsky, MD Primary Cardiologist: Dr. Herbie Baltimore  Patient Profile    55 yo female with PMH of mild CAD (s/p cath 2012 with LCrx OM non amenable to PCI), HLD, CVA, and migraines who presented to the [facility] ED with reports of central chest pressure.   Past Medical History    Past Medical History:  Diagnosis Date  . Arthritis    "not dx'd but my knees ache" (09/15/2013)  . CVA (cerebral vascular accident) (HCC) 10/2009; 2012   residual "my memory is not very good at times" (09/15/2013)  . Dizziness   . GERD (gastroesophageal reflux disease)   . High cholesterol   . Migraines    "had migraines in 1978" (09/15/2013)    Past Surgical History:  Procedure Laterality Date  . BREAST LUMPECTOMY Right 1978  . CARDIAC CATHETERIZATION  2005; 2012  . TONSILLECTOMY  1973     Allergies  Allergies  Allergen Reactions  . Codeine Nausea And Vomiting    Severe vomiting      History of Present Illness    Sue Armstrong is a 55 yo female who has undergone cardiac catheterizations on 2 different occasions which both times did not reveal any evidence of significant coronary artery disease, and etiology thought to be related to coronary spasm. Her last cath in July 2012 was precipitated by positive troponins. Also has history of CVA in 2010 in 2011, dyslipidemia, migraines and obesity. She was last seen in consultation by Dr. Herbie Baltimore on 1/13 he presented with reports of nonradiating chest pain that was relieved with nitroglycerin that she took home. She did rule out based off her troponin levels, and reported palpitations AT that time. Her metoprolol was changed to 12.5 twice a day, and she was continued on indoor and amlodipine at that time. Plan was to have her go for outpatient stress tests, and then follow-up in the office.  In talking with the patient she  reports remembering she had to reschedule this, and then forgot to follow-up. States she's been in her usual state of health up until Sunday, while she was at church he experienced a onset of centralized chest pressure. Reports some associated symptoms of shortness of breath at that time. States that the pressure eventually subsided on its own, but never fully resolved. She again experienced another episode Monday while she was at work at her desk, and then again Tuesday. Again stating that the pressure never fully resolved. States this morning she got ready and was driving in from [place] to [place] for work whenever she began to experience pressure again. She also experienced dyspnea, and nausea but denies any diaphoresis or flushing. At that time she decided to come to the ED for further evaluation.   In the ED her labs showed stable electrolytes, one negative POC troponin, and negative chest x-ray. EKG shows sinus rhythm with no acute ST or T-wave abnormalities. On telemetry she does exhibit some multiform PVCs. She does report having been on metoprolol in the past for symptom manic PVCs, but has not been on this medication for an extended period of time. Does report a brief episode of palpitations on Tuesday. She is currently pain-free.  Home Medications    Prior to Admission medications   Medication Sig Start Date End Date Taking? Authorizing Provider  acetaminophen (TYLENOL) 500 MG tablet Take 1,000 mg by  mouth every 6 (six) hours as needed for mild pain.    Yes Historical Provider, MD  ibuprofen (ADVIL,MOTRIN) 200 MG tablet Take 400 mg by mouth every 6 (six) hours as needed for mild pain.   Yes Historical Provider, MD  aspirin EC 81 MG tablet Take 1 tablet (81 mg total) by mouth daily. Patient not taking: Reported on 07/31/2016 09/16/13   Elease Etienne, MD  lovastatin (MEVACOR) 20 MG tablet Take 1 tablet (20 mg total) by mouth at bedtime. Patient not taking: Reported on 07/31/2016  09/16/13   Elease Etienne, MD    Family History    Family History  Problem Relation Age of Onset  . Heart disease Maternal Grandmother     Enlarged heart  . Cancer Maternal Grandmother     breast cancer   . Congestive Heart Failure Mother 31    No history of CAD, dialysis  . Prostate cancer Father 41    Social History    Social History   Social History  . Marital status: Widowed    Spouse name: N/A  . Number of children: 4  . Years of education: N/A   Occupational History  . Not on file.   Social History Main Topics  . Smoking status: Never Smoker  . Smokeless tobacco: Never Used  . Alcohol use No  . Drug use: No  . Sexual activity: Not Currently   Other Topics Concern  . Not on file   Social History Narrative   Lives with daughter     Review of Systems    General:  No chills, fever, night sweats or weight changes.  Cardiovascular: See HPI Dermatological: No rash, lesions/masses Respiratory: No cough, dyspnea Urologic: No hematuria, dysuria Abdominal:   No nausea, vomiting, diarrhea, bright red blood per rectum, melena, or hematemesis Neurologic:  No visual changes, wkns, changes in mental status. All other systems reviewed and are otherwise negative except as noted above.  Physical Exam    Blood pressure 121/72, pulse 66, temperature 97.7 F (36.5 C), resp. rate 17, SpO2 99 %.  General: Pleasant African-American female, NAD Psych: Normal affect. Neuro: Alert and oriented X 3. Moves all extremities spontaneously. HEENT: Normal  Neck: Supple without bruits or JVD. Lungs:  Resp regular and unlabored, CTA. Heart: RRR no s3, s4, 2/6 systolic murmur. Abdomen: Soft, non-tender, non-distended, BS + x 4.  Extremities: No clubbing, cyanosis or edema. DP/PT/Radials 2+ and equal bilaterally.  Labs    Troponin [facility] of Care Test)  Recent Labs  07/31/16 0949  TROPIPOC 0.00   No results for input(s): CKTOTAL, CKMB, TROPONINI in the last 72 hours. Lab  Results  Component Value Date   WBC 6.7 07/31/2016   HGB 13.7 07/31/2016   HCT 42.7 07/31/2016   MCV 90.7 07/31/2016   PLT 328 07/31/2016     Recent Labs Lab 07/31/16 0942  NA 140  K 3.9  CL 108  CO2 24  BUN 10  CREATININE 0.88  CALCIUM 9.3  GLUCOSE 96   Lab Results  Component Value Date   CHOL 207 (H) 09/16/2013   HDL 52 09/16/2013   LDLCALC 134 (H) 09/16/2013   TRIG 105 09/16/2013   Lab Results  Component Value Date   DDIMER <0.27 09/15/2013     Radiology Studies    Dg Chest 2 View  Result Date: 07/31/2016 CLINICAL DATA:  Chest pain. EXAM: CHEST  2 VIEW COMPARISON:  Radiographs of September 15, 2013. FINDINGS: The heart size and  mediastinal contours are within normal limits. Both lungs are clear. No pneumothorax or pleural effusion is noted. The visualized skeletal structures are unremarkable. IMPRESSION: No active cardiopulmonary disease. Electronically Signed   By: Lupita RaiderJames  Green Jr, M.D.   On: 07/31/2016 09:35    ECG & Cardiac Imaging    EKG: SR  ECHO: 03/19/10  Study Conclusions  - Left ventricle: Systolic function was normal. The estimated  ejection fraction was in the range of 60% to 65%. - Mitral valve: Mild to moderate regurgitation. - Left atrium: No evidence of thrombus in the atrial cavity or  appendage.     Assessment & Plan    55 yo female with PMH of mild CAD (s/p cath 2014 with LCrx OM non amenable to PCI), HLD, CVA, and migraines who [facility] with reports of central chest pressure.   Chest pain: Reports symptoms started on Sunday while she was sitting in church, states symptoms did eventually subside but had not fully resolved. She continued to have episodes of chest pressure Monday and Tuesday while at work, and again this morning while she was driving into work from [place]. They have associated symptoms of nausea, dyspnea with chest pressure. Last heart cath was in 2012 which noted a small OM off her left  circumflex which was not amendable to PCI. Appears that she was set up for possible stress testing in 2014 but did not follow through with this. In the ED she's had one negative POC troponin, and EKG showed sinus rhythm without acute ST/T-wave abnormalities. -- [facility] but this could be done as either in or outpt. Discussed with Dr. Antoine PocheHochrein and will set up for ETT in the office and f/u with Dr. Herbie BaltimoreHarding in the office.  -- Would also draw at least one more Trop prior to discharge.   Janice CoffinSigned, Sue Roberts, NP-C Pager [phone] 07/31/2016, 4:12 PM   History and all data above reviewed.  Patient examined.  I agree with the findings as above.  The patient presents with chest pain that is reproducible with palpation.  This has been on and off for several days.  No objective evidence of ischemia.  HEARTSCORE = 2.  The patient exam reveals COR:RRR  ,  Lungs: Clear  ,  Abd: Positive bowel sounds, no rebound no guarding, Ext No edema  Chest:  Pain to palpation. .  All available labs, radiology testing, previous records reviewed. Agree with documented assessment and plan. Chest pain:  Atypical greater than typical features.  Repeat enzyme.  If negative then the HEARTSCORE remains 2 and the patient can be discharged for an out patient POET (Plain Old Exercise Treadmill)  Rollene RotundaJames Melyssa Signor  4:24 PM  07/31/2016

## 2016-07-31 NOTE — ED Notes (Signed)
Patient Alert and oriented X4. Stable and ambulatory. Patient verbalized understanding of the discharge instructions.  Patient belongings were taken by the patient.  

## 2016-07-31 NOTE — Discharge Instructions (Signed)
Follow-up with cardiology as recommended by Dr. Antoine PocheHochrein.  Ibuprofen 600 mg every 6 hours as needed for pain.  Return to the emergency department if your symptoms significantly worsen or change.

## 2016-07-31 NOTE — ED Provider Notes (Addendum)
[facility]-EMERGENCY DEPT Provider Note   CSN: [id number] Arrival date & time: 07/31/16  0850     History   Chief Complaint Chief Complaint  Patient presents with  . Chest Pain    HPI Sue Armstrong is a 55 y.o. female.  Patient is a 55 year old female with prior history of MI, prior CVA, GERD. She presents for evaluation of chest discomfort. She reports this is been occurring off and on for several days, however became worse and more persistent this morning. She was at work at the time. She reports some shortness of breath and nausea, however no diaphoresis or radiation to the arm or jaw. She denies any recent exertional symptoms. She denies any aggravating or alleviating factors.      Past Medical History:  Diagnosis Date  . Arthritis    "not dx'd but my knees ache" (09/15/2013)  . CVA (cerebral vascular accident) (HCC) 10/2009; 2012   residual "my memory is not very good at times" (09/15/2013)  . Dizziness   . GERD (gastroesophageal reflux disease)   . High cholesterol   . MI (myocardial infarction) (HCC) 2012  . Migraines    "had migraines in 1978" (09/15/2013)    Patient Active Problem List   Diagnosis Date Noted  . History of MI (myocardial infarction) 10/03/2013  . Other chest pain 11/20/2011  . DYSLIPIDEMIA 03/21/2010  . CEREBROVASCULAR ACCIDENT 03/21/2010  . Obesity, unspecified 04/06/2008  . HYPOGLYCEMIA, HX OF 04/21/2007  . MIGRAINE, UNSPEC., W/O INTRACTABLE MIGRAINE 12/31/2006  . MENOPAUSAL SYNDROME 12/31/2006    Past Surgical History:  Procedure Laterality Date  . BREAST LUMPECTOMY Right 1978  . CARDIAC CATHETERIZATION  2005; 2012  . TONSILLECTOMY  1973    OB History    No data available       Home Medications    Prior to Admission medications   Medication Sig Start Date End Date Taking? Authorizing Provider  acetaminophen (TYLENOL) 500 MG tablet Take 1,000 mg by mouth every 6 (six) hours as needed.    Historical Provider, MD  aspirin EC 81  MG tablet Take 1 tablet (81 mg total) by mouth daily. 09/16/13   Elease Etienne, MD  lovastatin (MEVACOR) 20 MG tablet Take 1 tablet (20 mg total) by mouth at bedtime. 09/16/13   Elease Etienne, MD    Family History Family History  Problem Relation Age of Onset  . Heart disease Maternal Grandmother   . Cancer Maternal Grandmother     breast cancer     Social History Social History  Substance Use Topics  . Smoking status: Never Smoker  . Smokeless tobacco: Never Used  . Alcohol use No     Allergies   Codeine   Review of Systems Review of Systems  All other systems reviewed and are negative.    Physical Exam Updated Vital Signs BP 131/93   Pulse 67   Temp 97.7 F (36.5 C)   Resp 24   SpO2 99%   Physical Exam  Constitutional: She is oriented to person, place, and time. She appears well-developed and well-nourished. No distress.  HENT:  Head: Normocephalic and atraumatic.  Neck: Normal range of motion. Neck supple.  Cardiovascular: Normal rate and regular rhythm.  Exam reveals no gallop and no friction rub.   No murmur heard. Pulmonary/Chest: Effort normal and breath sounds normal. No respiratory distress. She has no wheezes.  Abdominal: Soft. Bowel sounds are normal. She exhibits no distension. There is no tenderness.  Musculoskeletal: Normal range  of motion. She exhibits no edema.  There is no calf swelling or tenderness. Homans sign is absent bilaterally.  Neurological: She is alert and oriented to person, place, and time.  Skin: Skin is warm and dry. She is not diaphoretic.  Nursing note and vitals reviewed.    ED Treatments / Results  Labs (all labs ordered are listed, but only abnormal results are displayed) Labs Reviewed  BASIC METABOLIC PANEL  CBC  I-STAT TROPOININ, ED    EKG  EKG Interpretation  Date/Time:  Thursday July 31 2016 08:59:12 EDT Ventricular Rate:  70 PR Interval:  168 QRS Duration: 70 QT Interval:  376 QTC  Calculation: 406 R Axis:   31 Text Interpretation:  Normal sinus rhythm Normal ECG Confirmed by Waldemar Siegel  MD, Dashauna Heymann ([id number]) on 07/31/2016 9:54:53 AM       Radiology Dg Chest 2 View  Result Date: 07/31/2016 CLINICAL DATA:  Chest pain. EXAM: CHEST  2 VIEW COMPARISON:  Radiographs of September 15, 2013. FINDINGS: The heart size and mediastinal contours are within normal limits. Both lungs are clear. No pneumothorax or pleural effusion is noted. The visualized skeletal structures are unremarkable. IMPRESSION: No active cardiopulmonary disease. Electronically Signed   By: Lupita RaiderJames  Green Jr, M.D.   On: 07/31/2016 09:35    Procedures Procedures (including critical care time)  Medications Ordered in ED Medications - No data to display   Initial Impression / Assessment and Plan / ED Course  I have reviewed the triage vital signs and the nursing notes.  Pertinent labs & imaging results that were available during my care of the patient were reviewed by me and considered in my medical decision making (see chart for details).  Clinical Course    Patient presents with complaints of chest discomfort. She apparently had an MI several years ago related to an occlusion and a very small artery. She presents today with complaints of discomfort that she feels is different from her prior episode. Her workup reveals an unchanged EKG and negative initial troponin. Due to her cardiac history, I will consult cardiology who will evaluate and determine the final disposition.  ADDENDUM:  Patient evaluated by cardiology. Second troponin is negative and symptoms felt not to be related to a cardiac etiology. She will be discharged with anti-inflammatories and when necessary return.  Final Clinical Impressions(s) / ED Diagnoses   Final diagnoses:  None    New Prescriptions New Prescriptions   No medications on file     Geoffery Lyonsouglas Sukhman Kocher, MD 07/31/16 1634    Geoffery Lyonsouglas Deundra Furber, MD 07/31/16 1655

## 2016-07-31 NOTE — ED Notes (Signed)
Cardiology at bedside.

## 2016-08-05 ENCOUNTER — Ambulatory Visit (INDEPENDENT_AMBULATORY_CARE_PROVIDER_SITE_OTHER): Payer: Self-pay

## 2016-08-05 DIAGNOSIS — R079 Chest pain, unspecified: Secondary | ICD-10-CM

## 2016-08-06 ENCOUNTER — Other Ambulatory Visit: Payer: Self-pay | Admitting: Cardiology

## 2016-08-06 DIAGNOSIS — R079 Chest pain, unspecified: Secondary | ICD-10-CM

## 2016-08-06 LAB — EXERCISE TOLERANCE TEST
CHL CUP RESTING HR STRESS: 70 {beats}/min
CHL CUP STRESS STAGE 1 DBP: 72 mmHg
CHL CUP STRESS STAGE 1 SBP: 121 mmHg
CHL CUP STRESS STAGE 1 SPEED: 0 mph
CHL CUP STRESS STAGE 2 GRADE: 0 %
CHL CUP STRESS STAGE 2 HR: 78 {beats}/min
CHL CUP STRESS STAGE 2 SPEED: 0 mph
CHL CUP STRESS STAGE 3 HR: 85 {beats}/min
CHL CUP STRESS STAGE 5 SPEED: 1.7 mph
CHL CUP STRESS STAGE 6 DBP: 61 mmHg
CHL CUP STRESS STAGE 6 GRADE: 0 %
CHL CUP STRESS STAGE 6 HR: 79 {beats}/min
CHL CUP STRESS STAGE 6 SBP: 198 mmHg
CHL CUP STRESS STAGE 7 SPEED: 0 mph
CSEPED: 2 min
CSEPPHR: 112 {beats}/min
CSEPPMHR: 68 %
Estimated workload: 4.6 METS
Exercise duration (sec): 7 s
MPHR: 164 {beats}/min
Percent HR: 69 %
RPE: 15
Stage 1 Grade: 0 %
Stage 1 HR: 80 {beats}/min
Stage 3 Grade: 0 %
Stage 3 Speed: 1 mph
Stage 4 Grade: 0 %
Stage 4 HR: 85 {beats}/min
Stage 4 Speed: 1 mph
Stage 5 Grade: 10 %
Stage 5 HR: 112 {beats}/min
Stage 6 Speed: 0 mph
Stage 7 Grade: 0 %
Stage 7 HR: 62 {beats}/min

## 2016-08-14 ENCOUNTER — Encounter: Payer: Self-pay | Admitting: Student

## 2016-08-14 ENCOUNTER — Ambulatory Visit: Payer: Self-pay | Admitting: Nurse Practitioner

## 2016-08-14 ENCOUNTER — Other Ambulatory Visit: Payer: Self-pay | Admitting: Student

## 2016-08-14 ENCOUNTER — Ambulatory Visit (INDEPENDENT_AMBULATORY_CARE_PROVIDER_SITE_OTHER): Payer: Self-pay | Admitting: Student

## 2016-08-14 VITALS — BP 120/82 | HR 65 | Ht 64.0 in | Wt 224.8 lb

## 2016-08-14 DIAGNOSIS — I2510 Atherosclerotic heart disease of native coronary artery without angina pectoris: Secondary | ICD-10-CM

## 2016-08-14 DIAGNOSIS — E669 Obesity, unspecified: Secondary | ICD-10-CM

## 2016-08-14 DIAGNOSIS — E785 Hyperlipidemia, unspecified: Secondary | ICD-10-CM

## 2016-08-14 DIAGNOSIS — R0789 Other chest pain: Secondary | ICD-10-CM

## 2016-08-14 NOTE — Progress Notes (Signed)
Cardiology Office Note    Date:  08/14/2016   ID:  Sue Armstrong, DOB Jun 12, 1961, MRN [id number]  PCP:  Sue Armstrong, CAMMIE, MD  Cardiologist:  Dr. Herbie BaltimoreHarding   Chief Complaint  Patient presents with  . Hospitalization Follow-up    Recent ER visit for Chest Pain    History of Present Illness:    Sue MccreedyMarion H Mankowski is a 55 y.o. female with past medical history of CAD (s/p cath in 2012 showing small OM occlusion not amenable to PCI with no other significant CAD - medical management recommended), HLD, and CVA (2010 and 2012) who presents to the office today for hospital follow-up.  She was recently seen in the ER for chest pain on 07/31/2016. While at church she experienced sudden onset of centralized chest pressure. Reported associated symptoms of shortness of breath at that time. Experienced two recurrent episodes the following days which occurred at rest and prompted her to seek evaluation. Initial and delta troponin were negative and EKG showed NSR with no acute ST or T-wave abnormalities.   She underwent an ETT on 10/3 which showed no ST segment changes. It was a suboptimal test as she only achieved 4.6 METS due to stopping early secondary to dyspnea and fatigue.   She reports doing well since her ER visit. She is exercising 5-6 days per week now for 20-30 minutes. Has been walking around her neighborhood and says she has altered her diet, consuming less fast food and more salads and vegetables. Has lost 1-2 pounds and is encouraged by this.   She denies any chest discomfort with activity. Reports dyspnea in the last few minutes of her walk which has greatly improved as her exercise tolerance has increased. Has noted 2-3 episodes of chest discomfort upon lying down to go to sleep at night. Says she feels a slight "stinging" sensation along her sternum which resolves spontaneously and she is able to go to sleep. She has not tried taking anything for the pain, as it resolves spontaneously and not that  severe. Consumes dinner between 1800 and 1900, going to bed by 2100. Has cut out greasy and fried foods, saying her pain has improved since doing this.  She is not currently on any medications. Lipids are followed by PCP and her Lovastatin was discontinued earlier this year secondary to stomach aches. Currently, she is without insurance coverage and is waiting for this to be reinitiated.   Past Medical History:  Diagnosis Date  . Arthritis    "not dx'd but my knees ache" (09/15/2013)  . CAD (coronary artery disease)    a. s/p cath in 2012 showing small OM occlusion not amenable to PCI with no other significant CAD - medical management recommended.   . CVA (cerebral vascular accident) (HCC) 10/2009; 2012   residual "my memory is not very good at times" (09/15/2013)  . Dizziness   . GERD (gastroesophageal reflux disease)   . High cholesterol   . Migraines    "had migraines in 1978" (09/15/2013)    Past Surgical History:  Procedure Laterality Date  . BREAST LUMPECTOMY Right 1978  . CARDIAC CATHETERIZATION  2005; 2012  . TONSILLECTOMY  1973    Current Medications: Outpatient Medications Prior to Visit  Medication Sig Dispense Refill  . acetaminophen (TYLENOL) 500 MG tablet Take 1,000 mg by mouth every 6 (six) hours as needed for mild pain.     Marland Kitchen. aspirin EC 81 MG tablet Take 1 tablet (81 mg total) by mouth daily.    .Marland Kitchen  ibuprofen (ADVIL,MOTRIN) 200 MG tablet Take 400 mg by mouth every 6 (six) hours as needed for mild pain.    Marland Kitchen lovastatin (MEVACOR) 20 MG tablet Take 1 tablet (20 mg total) by mouth at bedtime. 30 tablet 0   No facility-administered medications prior to visit.      Allergies:   Codeine   Social History   Social History  . Marital status: Widowed    Spouse name: N/A  . Number of children: 4  . Years of education: N/A   Social History Main Topics  . Smoking status: Never Smoker  . Smokeless tobacco: Never Used  . Alcohol use No  . Drug use: No  . Sexual  activity: Not Currently   Other Topics Concern  . None   Social History Narrative   Lives with daughter     Family History:  The patient's family history includes Cancer in her maternal grandmother; Congestive Heart Failure (age of onset: 64) in her mother; Heart disease in her maternal grandmother; Prostate cancer (age of onset: 84) in her father.   Review of Systems:   Please see the history of present illness.     General:  No chills, fever, night sweats. Positive for weight loss.  Cardiovascular:  No dyspnea on exertion, edema, orthopnea, palpitations, paroxysmal nocturnal dyspnea. Positive for chest pain.  Dermatological: No rash, lesions/masses Respiratory: No cough, dyspnea Urologic: No hematuria, dysuria Abdominal:   No nausea, vomiting, diarrhea, bright red blood per rectum, melena, or hematemesis Neurologic:  No visual changes, wkns, changes in mental status. All other systems reviewed and are otherwise negative except as noted above.   Physical Exam:    VS:  BP 120/82   Pulse 65   Ht 5\' 4"  (1.626 m)   Wt 224 lb 12.8 oz (102 kg)   BMI 38.59 kg/m    General: Obese African American female appearing in no acute distress. Head: Normocephalic, atraumatic, sclera non-icteric, no xanthomas, nares are without discharge.  Neck: No carotid bruits. JVD not elevated.  Lungs: Respirations regular and unlabored, without wheezes or rales.  Heart: Regular rate and rhythm. No S3 or S4.  No murmur, no rubs, or gallops appreciated. Abdomen: Soft, non-tender, non-distended with normoactive bowel sounds. No hepatomegaly. No rebound/guarding. No obvious abdominal masses. Msk:  Strength and tone appear normal for age. No joint deformities or effusions. Extremities: No clubbing or cyanosis. No edema.  Distal pedal pulses are 2+ bilaterally. Neuro: Alert and oriented X 3. Moves all extremities spontaneously. No focal deficits noted. Psych:  Responds to questions appropriately with a normal  affect. Skin: No rashes or lesions noted  Wt Readings from Last 3 Encounters:  08/14/16 224 lb 12.8 oz (102 kg)  10/03/13 210 lb 6.4 oz (95.4 kg)  09/15/13 210 lb 1.6 oz (95.3 kg)     Studies/Labs Reviewed:   EKG:  EKG is ordered today. The EKG ordered today demonstrates NSR, HR 65 with no acute ST or T-wave changes.   Recent Labs: 07/31/2016: BUN 10; Creatinine, Ser 0.88; Hemoglobin 13.7; Platelets 328; Potassium 3.9; Sodium 140   Lipid Panel    Component Value Date/Time   CHOL 207 (H) 09/16/2013 0519   TRIG 105 09/16/2013 0519   HDL 52 09/16/2013 0519   CHOLHDL 4.0 09/16/2013 0519   VLDL 21 09/16/2013 0519   LDLCALC 134 (H) 09/16/2013 0519    Additional studies/ records that were reviewed today include:   Exercise Tolerance Test: 08/05/2016  Blood pressure demonstrated a normal  response to exercise.  There was no ST segment deviation noted during stress.  Submaximal exercise treadmill test. Patient only achieved 4.6 mets and exercised only 2 minutes stopping due to SOB.  Nondiagnostic study due to submaximal Heart rate achieved at only 69% of MPHR.  Poor exercise capacity.  Assessment:    1. Atypical chest pain   2. Coronary artery disease involving native coronary artery of native heart without angina pectoris   3. Hyperlipidemia, unspecified hyperlipidemia type   4. Obesity (BMI 30-39.9)     Plan:   In order of problems listed above:  1. Atypical Chest Pain - recently seen in the ER for chest pain thought to be atypical for cardiac etiology. Cyclic troponin values were negative and EKG showed no acute ST or T-wave abnormalities.  - underwent ETT on 10/3 which showed no ST segment changes. It was a suboptimal test as she only achieved 4.6 METS due to stopping early secondary to dyspnea and fatigue. Personally reviewed EKG images from stress test today. - has increased her activity going from not exercising at all to walking 20-30 minutes per day. Denies any  chest discomfort with activity. Did have some dyspnea initially but this has improved, likely secondary to increased exercise tolerance.  - does report 2-3 episodes of chest discomfort over the past 3 weeks which has occurred when initially going to bed and lying down. Consumes food 2-3 hours prior to this. No exertional component noted and no exertional symptoms. Improved since cutting out fried foods from her diet. EKG today shows NSR, HR 65 with no acute ST or T-wave changes.  - overall, her chest discomfort seems most consistent with GERD. Although her ETT was sub-optimal, would not pursue Lexiscan Myoview at this time with her atypical symptoms. She was informed to try antiacids initially for this (does not have insurance coverage and wants to try OTC medications first). If no improvement in symptoms, symptoms worsen, or begin to occur with exertion, would pursue Lexiscan Myoview at that time (patient wants to hold off on further testing until insurance coverage is in place). Patient will let us know if this occurs, otherwise she will follow-up with Dr. Herbie Baltimore in 1 year.   2. CAD  - s/p cath in 2012 showing small OM occlusion not amenable to PCI with no other significant CAD. Medical management recommended. - symptoms described above atypical for a cardiac etiology. EKG today is without acute ST or T-wave changes.   3. HLD - followed by PCP. Lovastatin was discontinued earlier this year secondary to stomach aches. Has repeat Lipid Panel scheduled through PCP according to the patient.   4. Obesity - BMI currently 38.59. Has started exercising 5-6 days per week for 20-30 minutes at a time, gradually increasing the duration of her activity. Has greatly decreased fast food intake to 1 meal per week and cut-out fried foods. Consuming more salads and vegetables. - congratulated on this and encouraged to continue with these lifestyle changes.     Medication Adjustments/Labs and Tests  Ordered: Current medicines are reviewed at length with the patient today.  Concerns regarding medicines are outlined above.  Medication changes, Labs and Tests ordered today are listed in the Patient Instructions below. Patient Instructions  Medication Instructions: Take over the counter antacids (Tums, Rolaids or something similar)  Labwork: None   Testing/Procedures: None   Follow-Up: Your physician wants you to follow-up in: 12 months with Dr Herbie Baltimore. You will receive a reminder letter in the mail two  months in advance. If you don't receive a letter, please call our office to schedule the follow-up appointment.  Any Other Special Instructions Will Be Listed Below (If Applicable).  If you need a refill on your cardiac medications before your next appointment, please call your pharmacy.     Lorri Frederick, PA  08/14/2016 11:03 AM    [facility] [address] [place], [address] [place], [place]  [address] Phone: [phone]; Fax: [phone]  [address], [address] [place], [place] [address] Phone: [phone]

## 2016-08-14 NOTE — Patient Instructions (Signed)
Medication Instructions: Take over the counter antacids (Tums, Rolaids or something similar)  Labwork: None   Testing/Procedures: None   Follow-Up: Your physician wants you to follow-up in: 12 months with Dr Herbie BaltimoreHarding. You will receive a reminder letter in the mail two months in advance. If you don't receive a letter, please call our office to schedule the follow-up appointment.  Any Other Special Instructions Will Be Listed Below (If Applicable).     If you need a refill on your cardiac medications before your next appointment, please call your pharmacy.

## 2020-01-21 ENCOUNTER — Ambulatory Visit: Payer: Medicaid Other | Attending: Internal Medicine

## 2020-01-21 DIAGNOSIS — Z23 Encounter for immunization: Secondary | ICD-10-CM

## 2020-01-21 NOTE — Progress Notes (Signed)
   Covid-19 Vaccination Clinic  Name:  JANICA ELDRED    MRN: [id number] DOB: September 17, 1961  01/21/2020  Ms. Leng was observed post Covid-19 immunization for 15 minutes without incident. She was provided with Vaccine Information Sheet and instruction to access the V-Safe system.   Ms. Donate was instructed to call 911 with any severe reactions post vaccine: Marland Kitchen Difficulty breathing  . Swelling of face and throat  . A fast heartbeat  . A bad rash all over body  . Dizziness and weakness   Immunizations Administered    Name Date Dose VIS Date Route   Pfizer COVID-19 Vaccine 01/21/2020  9:56 AM 0.3 mL 10/14/2019 Intramuscular   Manufacturer: [employer], [employer]   Lot: [id number]   NDC: [address]-8

## 2020-02-15 ENCOUNTER — Ambulatory Visit: Payer: Medicaid Other | Attending: Internal Medicine

## 2020-02-15 DIAGNOSIS — Z23 Encounter for immunization: Secondary | ICD-10-CM

## 2020-02-15 NOTE — Progress Notes (Signed)
   Covid-19 Vaccination Clinic  Name:  YALISSA FINK    MRN: [id number] DOB: 02-10-1961  02/15/2020  Ms. Cocke was observed post Covid-19 immunization for 15 minutes without incident. She was provided with Vaccine Information Sheet and instruction to access the V-Safe system.   Ms. Hollopeter was instructed to call 911 with any severe reactions post vaccine: Marland Kitchen Difficulty breathing  . Swelling of face and throat  . A fast heartbeat  . A bad rash all over body  . Dizziness and weakness   Immunizations Administered    Name Date Dose VIS Date Route   Pfizer COVID-19 Vaccine 02/15/2020  9:47 AM 0.3 mL 10/14/2019 Intramuscular   Manufacturer: [employer], [employer]   Lot: [id number]   NDC: [address]-5

## 2020-09-01 ENCOUNTER — Ambulatory Visit: Payer: Medicaid Other | Attending: Internal Medicine

## 2020-09-01 ENCOUNTER — Other Ambulatory Visit: Payer: Self-pay

## 2020-09-01 DIAGNOSIS — Z23 Encounter for immunization: Secondary | ICD-10-CM

## 2020-09-01 NOTE — Progress Notes (Signed)
° °  Covid-19 Vaccination Clinic  Name:  Sue Armstrong    MRN: [id number] DOB: Dec 12, 1960  09/01/2020  [name] was observed post Covid-19 immunization for 15 minutes without incident. She was provided with Vaccine Information Sheet and instruction to access the V-Safe system.   [name] was instructed to call 911 with any severe reactions post vaccine:  Difficulty breathing   Swelling of face and throat   A fast heartbeat   A bad rash all over body   Dizziness and weakness
